# Patient Record
Sex: Female | Born: 1985 | Race: White | Hispanic: No | Marital: Married | Smoking: Never smoker
Health system: Southern US, Community
[De-identification: ages and names within clinical notes are randomized; demographics above are authoritative.]

## PROBLEM LIST (undated history)

## (undated) DIAGNOSIS — N189 Chronic kidney disease, unspecified: Secondary | ICD-10-CM

## (undated) DIAGNOSIS — I10 Essential (primary) hypertension: Secondary | ICD-10-CM

## (undated) DIAGNOSIS — D649 Anemia, unspecified: Secondary | ICD-10-CM

## (undated) DIAGNOSIS — F99 Mental disorder, not otherwise specified: Secondary | ICD-10-CM

## (undated) DIAGNOSIS — N946 Dysmenorrhea, unspecified: Secondary | ICD-10-CM

## (undated) DIAGNOSIS — M1990 Unspecified osteoarthritis, unspecified site: Secondary | ICD-10-CM

## (undated) DIAGNOSIS — R102 Pelvic and perineal pain: Secondary | ICD-10-CM

## (undated) DIAGNOSIS — N809 Endometriosis, unspecified: Secondary | ICD-10-CM

## (undated) DIAGNOSIS — F419 Anxiety disorder, unspecified: Secondary | ICD-10-CM

## (undated) DIAGNOSIS — F32A Depression, unspecified: Secondary | ICD-10-CM

## (undated) DIAGNOSIS — F329 Major depressive disorder, single episode, unspecified: Secondary | ICD-10-CM

## (undated) DIAGNOSIS — N920 Excessive and frequent menstruation with regular cycle: Secondary | ICD-10-CM

## (undated) DIAGNOSIS — G43909 Migraine, unspecified, not intractable, without status migrainosus: Secondary | ICD-10-CM

## (undated) HISTORY — DX: Dysmenorrhea, unspecified: N94.6

## (undated) HISTORY — DX: Unspecified osteoarthritis, unspecified site: M19.90

## (undated) HISTORY — DX: Mental disorder, not otherwise specified: F99

## (undated) HISTORY — DX: Anemia, unspecified: D64.9

## (undated) HISTORY — DX: Anxiety disorder, unspecified: F41.9

## (undated) HISTORY — DX: Excessive and frequent menstruation with regular cycle: N92.0

## (undated) HISTORY — DX: Depression, unspecified: F32.A

## (undated) HISTORY — PX: TUBAL LIGATION: SHX77

## (undated) HISTORY — DX: Chronic kidney disease, unspecified: N18.9

## (undated) HISTORY — DX: Major depressive disorder, single episode, unspecified: F32.9

## (undated) HISTORY — DX: Pelvic and perineal pain: R10.2

## (undated) HISTORY — DX: Essential (primary) hypertension: I10

## (undated) HISTORY — PX: STENT PLACEMENT RT URETER (ARMC HX): HXRAD1255

## (undated) HISTORY — DX: Endometriosis, unspecified: N80.9

---

## 2005-11-23 HISTORY — PX: CHOLECYSTECTOMY: SHX55

## 2014-11-23 HISTORY — PX: RENAL ARTERY STENT: SHX2321

## 2015-09-05 ENCOUNTER — Encounter: Payer: Self-pay | Admitting: Adult Health

## 2015-09-05 ENCOUNTER — Ambulatory Visit (INDEPENDENT_AMBULATORY_CARE_PROVIDER_SITE_OTHER): Payer: Medicaid Other | Admitting: Adult Health

## 2015-09-05 VITALS — BP 136/90 | HR 80 | Ht 60.0 in | Wt 181.5 lb

## 2015-09-05 DIAGNOSIS — N898 Other specified noninflammatory disorders of vagina: Secondary | ICD-10-CM

## 2015-09-05 DIAGNOSIS — D500 Iron deficiency anemia secondary to blood loss (chronic): Secondary | ICD-10-CM

## 2015-09-05 DIAGNOSIS — N946 Dysmenorrhea, unspecified: Secondary | ICD-10-CM

## 2015-09-05 DIAGNOSIS — N920 Excessive and frequent menstruation with regular cycle: Secondary | ICD-10-CM

## 2015-09-05 DIAGNOSIS — D649 Anemia, unspecified: Secondary | ICD-10-CM | POA: Insufficient documentation

## 2015-09-05 HISTORY — DX: Dysmenorrhea, unspecified: N94.6

## 2015-09-05 HISTORY — DX: Excessive and frequent menstruation with regular cycle: N92.0

## 2015-09-05 LAB — POCT WET PREP (WET MOUNT): WBC, Wet Prep HPF POC: POSITIVE

## 2015-09-05 NOTE — Patient Instructions (Signed)
Anemia, Nonspecific Anemia is a condition in which the concentration of red blood cells or hemoglobin in the blood is below normal. Hemoglobin is a substance in red blood cells that carries oxygen to the tissues of the body. Anemia results in not enough oxygen reaching these tissues.  CAUSES  Common causes of anemia include:   Excessive bleeding. Bleeding may be internal or external. This includes excessive bleeding from periods (in women) or from the intestine.   Poor nutrition.   Chronic kidney, thyroid, and liver disease.  Bone marrow disorders that decrease red blood cell production.  Cancer and treatments for cancer.  HIV, AIDS, and their treatments.  Spleen problems that increase red blood cell destruction.  Blood disorders.  Excess destruction of red blood cells due to infection, medicines, and autoimmune disorders. SIGNS AND SYMPTOMS   Minor weakness.   Dizziness.   Headache.  Palpitations.   Shortness of breath, especially with exercise.   Paleness.  Cold sensitivity.  Indigestion.  Nausea.  Difficulty sleeping.  Difficulty concentrating. Symptoms may occur suddenly or they may develop slowly.  DIAGNOSIS  Additional blood tests are often needed. These help your health care provider determine the best treatment. Your health care provider will check your stool for blood and look for other causes of blood loss.  TREATMENT  Treatment varies depending on the cause of the anemia. Treatment can include:   Supplements of iron, vitamin P82, or folic acid.   Hormone medicines.   A blood transfusion. This may be needed if blood loss is severe.   Hospitalization. This may be needed if there is significant continual blood loss.   Dietary changes.  Spleen removal. HOME CARE INSTRUCTIONS Keep all follow-up appointments. It often takes many weeks to correct anemia, and having your health care provider check on your condition and your response to  treatment is very important. SEEK IMMEDIATE MEDICAL CARE IF:   You develop extreme weakness, shortness of breath, or chest pain.   You become dizzy or have trouble concentrating.  You develop heavy vaginal bleeding.   You develop a rash.   You have bloody or black, tarry stools.   You faint.   You vomit up blood.   You vomit repeatedly.   You have abdominal pain.  You have a fever or persistent symptoms for more than 2-3 days.   You have a fever and your symptoms suddenly get worse.   You are dehydrated.  MAKE SURE YOU:  Understand these instructions.  Will watch your condition.  Will get help right away if you are not doing well or get worse.   This information is not intended to replace advice given to you by your health care provider. Make sure you discuss any questions you have with your health care provider.   Document Released: 12/17/2004 Document Revised: 07/12/2013 Document Reviewed: 05/05/2013 Elsevier Interactive Patient Education 2016 Elsevier Inc[employer] Dysmenorrhea Menstrual cramps (dysmenorrhea) are caused by the muscles of the uterus tightening (contracting) during a menstrual period. For some women, this discomfort is merely bothersome. For others, dysmenorrhea can be severe enough to interfere with everyday activities for a few days each month. Primary dysmenorrhea is menstrual cramps that last a couple of days when you start having menstrual periods or soon after. This often begins after a teenager starts having her period. As a woman gets older or has a baby, the cramps will usually lessen or disappear. Secondary dysmenorrhea begins later in life, lasts longer, and the pain may be stronger  than primary dysmenorrhea. The pain may start before the period and last a few days after the period.  CAUSES  Dysmenorrhea is usually caused by an underlying problem, such as:  The tissue lining the uterus grows outside of the uterus in other areas of the body  (endometriosis).  The endometrial tissue, which normally lines the uterus, is found in or grows into the muscular walls of the uterus (adenomyosis).  The pelvic blood vessels are engorged with blood just before the menstrual period (pelvic congestive syndrome).  Overgrowth of cells (polyps) in the lining of the uterus or cervix.  Falling down of the uterus (prolapse) because of loose or stretched ligaments.  Depression.  Bladder problems, infection, or inflammation.  Problems with the intestine, a tumor, or irritable bowel syndrome.  Cancer of the female organs or bladder.  A severely tipped uterus.  A very tight opening or closed cervix.  Noncancerous tumors of the uterus (fibroids).  Pelvic inflammatory disease (PID).  Pelvic scarring (adhesions) from a previous surgery.  Ovarian cyst.  An intrauterine device (IUD) used for birth control. RISK FACTORS You may be at greater risk of dysmenorrhea if:  You are younger than age 26.  You started puberty early.  You have irregular or heavy bleeding.  You have never given birth.  You have a family history of this problem.  You are a smoker. SIGNS AND SYMPTOMS   Cramping or throbbing pain in your lower abdomen.  Headaches.  Lower back pain.  Nausea or vomiting.  Diarrhea.  Sweating or dizziness.  Loose stools. DIAGNOSIS  A diagnosis is based on your history, symptoms, physical exam, diagnostic tests, or procedures. Diagnostic tests or procedures may include:  Blood tests.  Ultrasonography.  An examination of the lining of the uterus (dilation and curettage, D&C).  An examination inside your abdomen or pelvis with a scope (laparoscopy).  X-rays.  CT scan.  MRI.  An examination inside the bladder with a scope (cystoscopy).  An examination inside the intestine or stomach with a scope (colonoscopy, gastroscopy). TREATMENT  Treatment depends on the cause of the dysmenorrhea. Treatment may  include:  Pain medicine prescribed by your health care provider.  Birth control pills or an IUD with progesterone hormone in it.  Hormone replacement therapy.  Nonsteroidal anti-inflammatory drugs (NSAIDs). These may help stop the production of prostaglandins.  Surgery to remove adhesions, endometriosis, ovarian cyst, or fibroids.  Removal of the uterus (hysterectomy).  Progesterone shots to stop the menstrual period.  Cutting the nerves on the sacrum that go to the female organs (presacral neurectomy).  Electric current to the sacral nerves (sacral nerve stimulation).  Antidepressant medicine.  Psychiatric therapy, counseling, or group therapy.  Exercise and physical therapy.  Meditation and yoga therapy.  Acupuncture. HOME CARE INSTRUCTIONS   Only take over-the-counter or prescription medicines as directed by your health care provider.  Place a heating pad or hot water bottle on your lower back or abdomen. Do not sleep with the heating pad.  Use aerobic exercises, walking, swimming, biking, and other exercises to help lessen the cramping.  Massage to the lower back or abdomen may help.  Stop smoking.  Avoid alcohol and caffeine. SEEK MEDICAL CARE IF:   Your pain does not get better with medicine.  You have pain with sexual intercourse.  Your pain increases and is not controlled with medicines.  You have abnormal vaginal bleeding with your period.  You develop nausea or vomiting with your period that is not controlled  with medicine. SEEK IMMEDIATE MEDICAL CARE IF:  You pass out.    This information is not intended to replace advice given to you by your health care provider. Make sure you discuss any questions you have with your health care provider.   Document Released: 11/09/2005 Document Revised: 07/12/2013 Document Reviewed: 04/27/2013 Elsevier Interactive Patient Education 2016 [employer]. Menorrhagia Menorrhagia is the medical term for when your  menstrual periods are heavy or last longer than usual. With menorrhagia, every period you have may cause enough blood loss and cramping that you are unable to maintain your usual activities. CAUSES  In some cases, the cause of heavy periods is unknown, but a number of conditions may cause menorrhagia. Common causes include:  A problem with the hormone-producing thyroid gland (hypothyroid).  Noncancerous growths in the uterus (polyps or fibroids).  An imbalance of the estrogen and progesterone hormones.  One of your ovaries not releasing an egg during one or more months.  Side effects of having an intrauterine device (IUD).  Side effects of some medicines, such as anti-inflammatory medicines or blood thinners.  A bleeding disorder that stops your blood from clotting normally. SIGNS AND SYMPTOMS  During a normal period, bleeding lasts between 4 and 8 days. Signs that your periods are too heavy include:  You routinely have to change your pad or tampon every 1 or 2 hours because it is completely soaked.  You pass blood clots larger than 1 inch (2.5 cm) in size.  You have bleeding for more than 7 days.  You need to use pads and tampons at the same time because of heavy bleeding.  You need to wake up to change your pads or tampons during the night.  You have symptoms of anemia, such as tiredness, fatigue, or shortness of breath. DIAGNOSIS  Your health care provider will perform a physical exam and ask you questions about your symptoms and menstrual history. Other tests may be ordered based on what the health care provider finds during the exam. These tests can include:  Blood tests. Blood tests are used to check if you are pregnant or have hormonal changes, a bleeding or thyroid disorder, low iron levels (anemia), or other problems.  Endometrial biopsy. Your health care provider takes a sample of tissue from the inside of your uterus to be examined under a microscope.  Pelvic  ultrasound. This test uses sound waves to make a picture of your uterus, ovaries, and vagina. The pictures can show if you have fibroids or other growths.  Hysteroscopy. For this test, your health care provider will use a small telescope to look inside your uterus. Based on the results of your initial tests, your health care provider may recommend further testing. TREATMENT  Treatment may not be needed. If it is needed, your health care provider may recommend treatment with one or more medicines first. If these do not reduce bleeding enough, a surgical treatment might be an option. The best treatment for you will depend on:   Whether you need to prevent pregnancy.  Your desire to have children in the future.  The cause and severity of your bleeding.  Your opinion and personal preference.  Medicines for menorrhagia may include:  Birth control methods that use hormones. These include the pill, skin patch, vaginal ring, shots that you get every 3 months, hormonal IUD, and implant. These treatments reduce bleeding during your menstrual period.  Medicines that thicken blood and slow bleeding.  Medicines that reduce swelling, such as  ibuprofen.  Medicines that contain a synthetic hormone called progestin.   Medicines that make the ovaries stop working for a short time.  You may need surgical treatment for menorrhagia if the medicines are unsuccessful. Treatment options include:  Dilation and curettage (D&C). In this procedure, your health care provider opens (dilates) your cervix and then scrapes or suctions tissue from the lining of your uterus to reduce menstrual bleeding.  Operative hysteroscopy. This procedure uses a tiny tube with a light (hysteroscope) to view your uterine cavity and can help in the surgical removal of a polyp that may be causing heavy periods.  Endometrial ablation. Through various techniques, your health care provider permanently destroys the entire lining of  your uterus (endometrium). After endometrial ablation, most women have little or no menstrual flow. Endometrial ablation reduces your ability to become pregnant.  Endometrial resection. This surgical procedure uses an electrosurgical wire loop to remove the lining of the uterus. This procedure also reduces your ability to become pregnant.  Hysterectomy. Surgical removal of the uterus and cervix is a permanent procedure that stops menstrual periods. Pregnancy is not possible after a hysterectomy. This procedure requires anesthesia and hospitalization. HOME CARE INSTRUCTIONS   Only take over-the-counter or prescription medicines as directed by your health care provider. Take prescribed medicines exactly as directed. Do not change or switch medicines without consulting your health care provider.  Take any prescribed iron pills exactly as directed by your health care provider. Long-term heavy bleeding may result in low iron levels. Iron pills help replace the iron your body lost from heavy bleeding. Iron may cause constipation. If this becomes a problem, increase the bran, fruits, and roughage in your diet.  Do not take aspirin or medicines that contain aspirin 1 week before or during your menstrual period. Aspirin may make the bleeding worse.  If you need to change your sanitary pad or tampon more than once every 2 hours, stay in bed and rest as much as possible until the bleeding stops.  Eat well-balanced meals. Eat foods high in iron. Examples are leafy green vegetables, meat, liver, eggs, and whole grain breads and cereals. Do not try to lose weight until the abnormal bleeding has stopped and your blood iron level is back to normal. SEEK MEDICAL CARE IF:   You soak through a pad or tampon every 1 or 2 hours, and this happens every time you have a period.  You need to use pads and tampons at the same time because you are bleeding so much.  You need to change your pad or tampon during the  night.  You have a period that lasts for more than 8 days.  You pass clots bigger than 1 inch wide.  You have irregular periods that happen more or less often than once a month.  You feel dizzy or faint.  You feel very weak or tired.  You feel short of breath or feel your heart is beating too fast when you exercise.  You have nausea and vomiting or diarrhea while you are taking your medicine.  You have any problems that may be related to the medicine you are taking. SEEK IMMEDIATE MEDICAL CARE IF:   You soak through 4 or more pads or tampons in 2 hours.  You have any bleeding while you are pregnant. MAKE SURE YOU:   Understand these instructions.  Will watch your condition.  Will get help right away if you are not doing well or get worse.   This  information is not intended to replace advice given to you by your health care provider. Make sure you discuss any questions you have with your health care provider.   Document Released: 11/09/2005 Document Revised: 11/14/2013 Document Reviewed: 04/30/2013 Elsevier Interactive Patient Education 2016 [employer]. Take iron Return next week for [place] then see me 2 days later for pap and physical

## 2015-09-05 NOTE — Progress Notes (Signed)
Subjective:     Patient ID: Jessica Mckay, female   DOB: [birth year], 29 y.o.   MRN: [id number]  HPI Jessica Mckay is a 29 year old white female, engaged, G4P3 A1, new to this practice, in complaining of heavy periods for 3-4 months, she can change every 30 minutes at times.She also has anemia, hgb 8.5 and hct 29.4 08/18/15 at [facility], when admitted for kidney stone and stent insertion.She has had tubal but wants reversal in future.She has clots and cramps and is tired, she is taking iron supplement from hospital.She has not had pap and physical in over 3 years.  Review of Systems Patient denies any headaches, hearing loss,  blurred vision, shortness of breath, chest pain, abdominal pain, problems with bowel movements, urination, or intercourse. No joint pain or mood swings.See HPI for positives. Reviewed past medical,surgical, social and family history. Reviewed medications and allergies.     Objective:   Physical Exam BP 136/90 mmHg  Pulse 80  Ht 5' (1.524 m)  Wt 181 lb 8 oz (82.328 kg)  BMI 35.45 kg/m2  LMP 08/10/2015 Skin warm and dry. Neck: mid line trachea, normal thyroid, good ROM, no lymphadenopathy noted. Lungs: clear to ausculation bilaterally. Cardiovascular: regular rate and rhythm,Abdomen soft, non tender, no HSM noted.Pelvic: external genitalia is normal in appearance no lesions, vagina: tan discharge without odor,urethra has no lesions or masses noted, cervix is everted and non tender, uterus: normal size, shape and contour, mildly tender, no masses felt, adnexa: no masses, mildly  tender throughout. Bladder is non tender and no masses felt. Wet prep:  +WBCs. GC/CHL obtained. Discussed getting labs and [place] and then discussed trying OCs or megace to stop periods and she agrees for now, also discussed that she had had 3 c-sections and that tubal reversal not a guarantee of getting pregnant and she understands. Reviewed her labs from [facility] that she brought with her.    Assessment:      Menorrhagia Dysmenorrhea Anemia     Plan:     Check CBC,CMP,TSH Return in 5-6 days for gyn [place] and then see me 2 days later for pap and physical GC/CHL sent Review handouts on menorrhagia and dysmenorrhea and anemia Continue iron

## 2015-09-06 ENCOUNTER — Telehealth: Payer: Self-pay | Admitting: Adult Health

## 2015-09-06 LAB — COMPREHENSIVE METABOLIC PANEL
ALK PHOS: 86 IU/L (ref 39–117)
ALT: 12 IU/L (ref 0–32)
AST: 13 IU/L (ref 0–40)
Albumin/Globulin Ratio: 1.6 (ref 1.1–2.5)
Albumin: 4.3 g/dL (ref 3.5–5.5)
BILIRUBIN TOTAL: 0.4 mg/dL (ref 0.0–1.2)
BUN/Creatinine Ratio: 12 (ref 8–20)
BUN: 9 mg/dL (ref 6–20)
CHLORIDE: 99 mmol/L (ref 97–108)
CO2: 23 mmol/L (ref 18–29)
Calcium: 9.3 mg/dL (ref 8.7–10.2)
Creatinine, Ser: 0.73 mg/dL (ref 0.57–1.00)
GFR calc Af Amer: 129 mL/min/{1.73_m2} (ref >59)
GFR calc non Af Amer: 112 mL/min/{1.73_m2} (ref >59)
GLUCOSE: 87 mg/dL (ref 65–99)
Globulin, Total: 2.7 g/dL (ref 1.5–4.5)
Potassium: 4.1 mmol/L (ref 3.5–5.2)
Sodium: 140 mmol/L (ref 134–144)
Total Protein: 7 g/dL (ref 6.0–8.5)

## 2015-09-06 LAB — CBC
Hematocrit: 29.4 % — ABNORMAL LOW (ref 34.0–46.6)
Hemoglobin: 8.4 g/dL — ABNORMAL LOW (ref 11.1–15.9)
MCH: 20.4 pg — AB (ref 26.6–33.0)
MCHC: 28.6 g/dL — AB (ref 31.5–35.7)
MCV: 72 fL — ABNORMAL LOW (ref 79–97)
PLATELETS: 343 10*3/uL (ref 150–379)
RBC: 4.11 x10E6/uL (ref 3.77–5.28)
RDW: 16.4 % — ABNORMAL HIGH (ref 12.3–15.4)
WBC: 7.5 10*3/uL (ref 3.4–10.8)

## 2015-09-06 LAB — TSH: TSH: 1.41 u[IU]/mL (ref 0.450–4.500)

## 2015-09-06 NOTE — Telephone Encounter (Signed)
Pt aware of labs and is taking iron bid now hgb 8.4,keep appt next week

## 2015-09-07 LAB — GC/CHLAMYDIA PROBE AMP
Chlamydia trachomatis, NAA: NEGATIVE
Neisseria gonorrhoeae by PCR: NEGATIVE

## 2015-09-10 ENCOUNTER — Ambulatory Visit (INDEPENDENT_AMBULATORY_CARE_PROVIDER_SITE_OTHER): Payer: Medicaid Other

## 2015-09-10 DIAGNOSIS — N920 Excessive and frequent menstruation with regular cycle: Secondary | ICD-10-CM

## 2015-09-10 DIAGNOSIS — N946 Dysmenorrhea, unspecified: Secondary | ICD-10-CM | POA: Diagnosis not present

## 2015-09-10 NOTE — Progress Notes (Signed)
PELVIC US TA/TV: normal anteverted uterus,normal ov's bilat (mobile),small amount of cul de sac fluid,mult small nabothian cysts,EEC 21 mm,4.5 x 3.5 x 2 cm complex collection ant to bladder,deep to c-section scar.  Dr Glo Herring review images with pt during ultrasound.

## 2015-09-13 ENCOUNTER — Encounter: Payer: Self-pay | Admitting: Adult Health

## 2015-09-13 ENCOUNTER — Other Ambulatory Visit (HOSPITAL_COMMUNITY)
Admission: RE | Admit: 2015-09-13 | Discharge: 2015-09-13 | Disposition: A | Discharge status: Home / Self Care | Payer: Medicaid Other | Source: Ambulatory Visit | Attending: Adult Health | Admitting: Adult Health

## 2015-09-13 ENCOUNTER — Ambulatory Visit (INDEPENDENT_AMBULATORY_CARE_PROVIDER_SITE_OTHER): Payer: Medicaid Other | Admitting: Adult Health

## 2015-09-13 VITALS — BP 140/90 | HR 80 | Ht 60.0 in | Wt 184.0 lb

## 2015-09-13 DIAGNOSIS — Z1151 Encounter for screening for human papillomavirus (HPV): Secondary | ICD-10-CM | POA: Diagnosis not present

## 2015-09-13 DIAGNOSIS — N80129 Deep endometriosis of ovary, unspecified ovary: Secondary | ICD-10-CM

## 2015-09-13 DIAGNOSIS — Z01411 Encounter for gynecological examination (general) (routine) with abnormal findings: Secondary | ICD-10-CM | POA: Insufficient documentation

## 2015-09-13 DIAGNOSIS — Z01419 Encounter for gynecological examination (general) (routine) without abnormal findings: Secondary | ICD-10-CM

## 2015-09-13 DIAGNOSIS — N809 Endometriosis, unspecified: Secondary | ICD-10-CM

## 2015-09-13 DIAGNOSIS — N946 Dysmenorrhea, unspecified: Secondary | ICD-10-CM

## 2015-09-13 DIAGNOSIS — Z1389 Encounter for screening for other disorder: Secondary | ICD-10-CM | POA: Diagnosis not present

## 2015-09-13 DIAGNOSIS — N920 Excessive and frequent menstruation with regular cycle: Secondary | ICD-10-CM

## 2015-09-13 DIAGNOSIS — Z0000 Encounter for general adult medical examination without abnormal findings: Secondary | ICD-10-CM

## 2015-09-13 DIAGNOSIS — D500 Iron deficiency anemia secondary to blood loss (chronic): Secondary | ICD-10-CM

## 2015-09-13 HISTORY — DX: Endometriosis, unspecified: N80.9

## 2015-09-13 HISTORY — DX: Deep endometriosis of ovary, unspecified ovary: N80.129

## 2015-09-13 LAB — POCT URINALYSIS DIPSTICK
Glucose, UA: NEGATIVE
Leukocytes, UA: NEGATIVE
NITRITE UA: NEGATIVE
Protein, UA: NEGATIVE
RBC UA: NEGATIVE
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
## 2015-09-13 NOTE — Patient Instructions (Signed)
Physical in  1 year Request records on mammogram Return in 1 week for endo biopsy with Dr Glo Herring

## 2015-09-16 LAB — CYTOLOGY - PAP

## 2015-09-18 ENCOUNTER — Telehealth: Payer: Self-pay | Admitting: Adult Health

## 2015-09-18 NOTE — Telephone Encounter (Signed)
Pt aware pap abnormal,LSIL with +HPV will add colpo to appt Friday am has endo bx scheduled then with [facility]

## 2015-09-19 ENCOUNTER — Other Ambulatory Visit: Payer: Medicaid Other | Admitting: Obstetrics and Gynecology

## 2015-09-20 ENCOUNTER — Ambulatory Visit (INDEPENDENT_AMBULATORY_CARE_PROVIDER_SITE_OTHER): Payer: Medicaid Other | Admitting: Obstetrics and Gynecology

## 2015-09-20 ENCOUNTER — Encounter: Payer: Self-pay | Admitting: Obstetrics and Gynecology

## 2015-09-20 ENCOUNTER — Other Ambulatory Visit: Payer: Self-pay | Admitting: Obstetrics and Gynecology

## 2015-09-20 VITALS — BP 118/70 | Ht 60.0 in | Wt 181.5 lb

## 2015-09-20 DIAGNOSIS — Z3202 Encounter for pregnancy test, result negative: Secondary | ICD-10-CM

## 2015-09-20 DIAGNOSIS — R87612 Low grade squamous intraepithelial lesion on cytologic smear of cervix (LGSIL): Secondary | ICD-10-CM

## 2015-09-20 DIAGNOSIS — Z3200 Encounter for pregnancy test, result unknown: Secondary | ICD-10-CM

## 2015-09-20 DIAGNOSIS — N809 Endometriosis, unspecified: Secondary | ICD-10-CM

## 2015-09-20 DIAGNOSIS — N80129 Deep endometriosis of ovary, unspecified ovary: Secondary | ICD-10-CM

## 2015-09-20 LAB — POCT URINE PREGNANCY: Preg Test, Ur: NEGATIVE
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
## 2015-09-20 NOTE — Progress Notes (Signed)
Patient ID: Jessica Mckay, female   DOB: 10-Sep-1986, 29 y.o.   MRN: [id number] Pt here today for a colposcopy and an endometrial biopsy.

## 2015-10-04 ENCOUNTER — Ambulatory Visit (INDEPENDENT_AMBULATORY_CARE_PROVIDER_SITE_OTHER): Payer: Medicaid Other | Admitting: Obstetrics and Gynecology

## 2015-10-04 ENCOUNTER — Encounter: Payer: Self-pay | Admitting: Obstetrics and Gynecology

## 2015-10-04 VITALS — BP 120/80 | Ht 60.0 in | Wt 183.0 lb

## 2015-10-04 DIAGNOSIS — N80129 Deep endometriosis of ovary, unspecified ovary: Secondary | ICD-10-CM

## 2015-10-04 DIAGNOSIS — N809 Endometriosis, unspecified: Secondary | ICD-10-CM

## 2015-10-04 DIAGNOSIS — N806 Endometriosis in cutaneous scar: Secondary | ICD-10-CM

## 2015-10-04 MED ORDER — NORETHIN-ETH ESTRAD-FE BIPHAS 1 MG-10 MCG / 10 MCG PO TABS: 1.0000 | ORAL_TABLET | Freq: Every day | ORAL | Status: DC

## 2015-10-04 NOTE — Progress Notes (Signed)
Patient ID: Jessica Mckay, female   DOB: 03/22/1986, 29 y.o.   MRN: [id number] Pt here today for results.

## 2015-10-04 NOTE — Progress Notes (Signed)
Patient ID: Jessica Mckay, female   DOB: 12-Dec-1985, 29 y.o.   MRN: [id number]   [facility] Visit  Patient name: Jessica Mckay MRN [id number]  Date of birth: [birth year]  CC & HPI:  Jessica Mckay is a 29 y.o. 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 Additionally, pt has c/s scar endometrioma that is severely painful with menses. Further, pt reiterates that she wishes to have a tubal reversal-- pt reports she continues to save money for the procedure.   ROS:  For discussion only.   Pertinent History Reviewed:   Reviewed: Significant for cesarean section, endometrioma, menorrhagia with regular cycle, dysmenorrhea.  Medical         Past Medical History  Diagnosis Date  . Anemia   . Arthritis   . Hypertension     during pregnancy  . Menorrhagia with regular cycle 09/05/2015  . Dysmenorrhea 09/05/2015  . Chronic kidney disease     kidney stones and pyleo  . Endometrioma 09/13/2015                              Surgical Hx:    Past Surgical History  Procedure Laterality Date  . Cesarean section  2002,2006,2010  . Cholecystectomy  2007  . Tubal ligation    . Stent placement rt ureter ([facility] hx)     Medications: Reviewed & Updated - see associated section                       Current outpatient prescriptions:  .  iron polysaccharides (FERREX 150) 150 MG capsule, Take 150 mg by mouth 2 (two) times daily. , Disp: , Rfl:    Social History: Reviewed -  reports that she has never smoked. She has never used smokeless tobacco.  Objective Findings:  Vitals: Blood pressure 120/80, height 5' (1.524 m), weight 183 lb (83.008 kg), last menstrual period 09/16/2015.  Physical Examination: For discussion only.   Assessment & Plan:   A:  1. 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 2. Incision scar endometrioma 3. 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   P:  1. Pap in  one year.  2. Rx for [facility].  3. Continuous OCPs until pt ready for tubal reversal.      By signing my name below, I, Terressa Koyanagi, attest that this documentation has been prepared under the direction and in the presence of Mallory Shirk, MD. Electronically Signed: Terressa Koyanagi, ED Scribe. 10/04/2015. 9:18 AM.   `````Attestation of Attending Supervision of Advanced Practitioner: Evaluation and management procedures were performed by the PA/NP/CNM/OB Fellow under my supervision/collaboration. Chart reviewed and agree with management and plan.  Deazia Lampi V 10/04/2015 9:25 AM   .Alecia Lemming

## 2015-11-04 ENCOUNTER — Telehealth: Payer: Self-pay | Admitting: Adult Health

## 2015-11-04 NOTE — Telephone Encounter (Signed)
Called pt had reports of mammogram and [place] from 2014, it suggested F/U mammogram at right for ?lymph node, but her exam was normal this year and she is without complaints and declines further imaging at this time

## 2015-11-08 ENCOUNTER — Encounter: Payer: Self-pay | Admitting: Obstetrics and Gynecology

## 2016-02-12 ENCOUNTER — Encounter: Payer: Self-pay | Admitting: Adult Health

## 2016-02-12 ENCOUNTER — Ambulatory Visit (INDEPENDENT_AMBULATORY_CARE_PROVIDER_SITE_OTHER): Payer: Medicaid Other | Admitting: Adult Health

## 2016-02-12 VITALS — BP 130/80 | HR 80 | Ht 60.0 in | Wt 197.0 lb

## 2016-02-12 DIAGNOSIS — Z3202 Encounter for pregnancy test, result negative: Secondary | ICD-10-CM | POA: Diagnosis not present

## 2016-02-12 DIAGNOSIS — N809 Endometriosis, unspecified: Secondary | ICD-10-CM

## 2016-02-12 DIAGNOSIS — D500 Iron deficiency anemia secondary to blood loss (chronic): Secondary | ICD-10-CM

## 2016-02-12 DIAGNOSIS — N920 Excessive and frequent menstruation with regular cycle: Secondary | ICD-10-CM

## 2016-02-12 DIAGNOSIS — R102 Pelvic and perineal pain: Secondary | ICD-10-CM | POA: Diagnosis not present

## 2016-02-12 DIAGNOSIS — N946 Dysmenorrhea, unspecified: Secondary | ICD-10-CM | POA: Diagnosis not present

## 2016-02-12 DIAGNOSIS — N80129 Deep endometriosis of ovary, unspecified ovary: Secondary | ICD-10-CM

## 2016-02-12 HISTORY — DX: Pelvic and perineal pain: R10.2

## 2016-02-12 LAB — POCT URINE PREGNANCY: Preg Test, Ur: NEGATIVE

## 2016-02-12 MED ORDER — MEGESTROL ACETATE 40 MG PO TABS: 40.0000 mg | ORAL_TABLET | Freq: Every day | ORAL | Status: DC

## 2016-02-12 NOTE — Progress Notes (Signed)
Subjective:     Patient ID: Jessica Mckay, female   DOB: 1986-05-17, 30 y.o.   MRN: [id number]  HPI Jessica Mckay is a 30 year old white female in complaining of pain at C section scar, has known endometrioma and has frequent headaches and is tired and feels weak at times, and period heavy and painful.She was placed on iron for anemia and was given OCs, but she stopped them, she said they made her sick.  Review of Systems Patient denies any  hearing loss, blurred vision, shortness of breath, chest pain,  problems with bowel movements, urination, or intercourse. No joint pain or mood swings. See HPI for positives Reviewed past medical,surgical, social and family history. Reviewed medications and allergies.     Objective:   Physical Exam BP 130/80 mmHg  Pulse 80  Ht 5' (1.524 m)  Wt 197 lb (89.359 kg)  BMI 38.47 kg/m2  LMP 01/22/2016 UPT negative. Skin warm and dry, Lungs: clear to ausculation bilaterally. Cardiovascular: regular rate and rhythm..Pelvic: external genitalia is normal in appearance no lesions, vagina: scant discharge without odor,urethra has no lesions or masses noted, cervix:smooth, uterus: normal size, shape and contour, non tender, no masses felt, adnexa: no masses or tenderness noted. Bladder is non tender and no masses felt. Abdomen tender at C section scar.   Will try megace to stop periods, she says she still wants tubal reversal.    Assessment:     Menorrhagia  Dysmenorrhea Anemia  Endometrioma Pelvic pain     Plan:    Continue Iron tabs daily  Check CBC Rex Megace 40 mg #30 take 1 daily with 1 refill Follow up in 4 weeks

## 2016-02-12 NOTE — Patient Instructions (Signed)
Take iron and megace Follow up in 4 weeks

## 2016-02-13 ENCOUNTER — Telehealth: Payer: Self-pay | Admitting: Adult Health

## 2016-02-13 LAB — CBC
HEMATOCRIT: 33.9 % — AB (ref 34.0–46.6)
HEMOGLOBIN: 10.1 g/dL — AB (ref 11.1–15.9)
MCH: 22.1 pg — ABNORMAL LOW (ref 26.6–33.0)
MCHC: 29.8 g/dL — ABNORMAL LOW (ref 31.5–35.7)
MCV: 74 fL — ABNORMAL LOW (ref 79–97)
Platelets: 287 10*3/uL (ref 150–379)
RBC: 4.56 x10E6/uL (ref 3.77–5.28)
RDW: 18.4 % — ABNORMAL HIGH (ref 12.3–15.4)
WBC: 9.2 10*3/uL (ref 3.4–10.8)

## 2016-02-13 NOTE — Telephone Encounter (Signed)
Pt aware HGB 10.1 which is better, keep taking iron

## 2016-03-11 ENCOUNTER — Encounter: Payer: Self-pay | Admitting: Adult Health

## 2016-03-11 ENCOUNTER — Ambulatory Visit (INDEPENDENT_AMBULATORY_CARE_PROVIDER_SITE_OTHER): Payer: Medicaid Other | Admitting: Adult Health

## 2016-03-11 VITALS — BP 122/72 | HR 86 | Ht 60.0 in | Wt 198.5 lb

## 2016-03-11 DIAGNOSIS — D500 Iron deficiency anemia secondary to blood loss (chronic): Secondary | ICD-10-CM

## 2016-03-11 DIAGNOSIS — N920 Excessive and frequent menstruation with regular cycle: Secondary | ICD-10-CM

## 2016-03-11 DIAGNOSIS — N946 Dysmenorrhea, unspecified: Secondary | ICD-10-CM

## 2016-03-11 MED ORDER — KETOROLAC TROMETHAMINE 10 MG PO TABS: 10.0000 mg | ORAL_TABLET | Freq: Four times a day (QID) | ORAL | Status: DC | PRN

## 2016-03-11 MED ORDER — MEGESTROL ACETATE 40 MG PO TABS: ORAL_TABLET | ORAL | Status: DC

## 2016-03-11 NOTE — Patient Instructions (Signed)
Take megace  try toradol  Follow up in 4 weeks Take iron

## 2016-03-11 NOTE — Progress Notes (Signed)
Subjective:     Patient ID: Jessica Mckay, female   DOB: [birth year], 30 y.o.   MRN: [id number]  HPI [name] is a 30 year old white female back fin follow up of starting megace to stop bleeding, it helped but bleeding back and has cramps and does not sleep well when cramping, still wants tubal reversal for 1 more child. HGB was 10.1 last visit which was better.  Review of Systems Patient denies any headaches, hearing loss, fatigue, blurred vision, shortness of breath, chest pain, problems with bowel movements, urination, or intercourse. No joint pain or mood swings.See HPI for positives Reviewed past medical,surgical, social and family history. Reviewed medications and allergies.     Objective:   Physical Exam BP 122/72 mmHg  Pulse 86  Ht 5' (1.524 m)  Wt 198 lb 8 oz (90.039 kg)  BMI 38.77 kg/m2  LMP 04/18/2017Talk only, bleeding had stopped but started back yesterday, still has cramps, will increase megace dose for several days and try toradol, follow up in 4 weeks will check CBC then. Face time 10 minutes talking and counseling.    Assessment:     Menorrhagia Dysmenorrhea Anemia     Plan:    Push fluids Continue iron tablets daily Rx megace 40 mg #45 3 x 5 days then 2 x 5 days then 1 daily with 1 refill   Rx toradol 10 mg #20 take 1 every 6 hours prn severe cramps no refills Follow up in 4 weeks,check CBC then

## 2016-04-08 ENCOUNTER — Encounter: Payer: Self-pay | Admitting: Adult Health

## 2016-04-08 ENCOUNTER — Ambulatory Visit (INDEPENDENT_AMBULATORY_CARE_PROVIDER_SITE_OTHER): Payer: Medicaid Other | Admitting: Adult Health

## 2016-04-08 VITALS — BP 136/80 | HR 89 | Ht 60.0 in | Wt 200.0 lb

## 2016-04-08 DIAGNOSIS — N920 Excessive and frequent menstruation with regular cycle: Secondary | ICD-10-CM

## 2016-04-08 DIAGNOSIS — N946 Dysmenorrhea, unspecified: Secondary | ICD-10-CM

## 2016-04-08 MED ORDER — MEGESTROL ACETATE 40 MG PO TABS: 40.0000 mg | ORAL_TABLET | Freq: Two times a day (BID) | ORAL | Status: DC

## 2016-04-08 MED ORDER — KETOROLAC TROMETHAMINE 10 MG PO TABS: 10.0000 mg | ORAL_TABLET | Freq: Four times a day (QID) | ORAL | Status: DC | PRN

## 2016-04-08 NOTE — Progress Notes (Signed)
Subjective:     Patient ID: Jessica Mckay, female   DOB: 04/14/1986, 30 y.o.   MRN: [id number]  HPI Jessica Mckay is a 30 year old white female back in follow up of taking megace, bleeding is on and off and is lighter, she passed some tissue, looked like decidual cast, still cramps and is tired and has headaches at times.Not sleeping good.   Review of Systems  Patient denies any  Daily headaches, hearing loss, blurred vision, shortness of breath, chest pain, problems with bowel movements, urination, or intercourse. No joint pain or mood swings. See HPI for positives. Reviewed past medical,surgical, social and family history. Reviewed medications and allergies.     Objective:   Physical Exam BP 136/80 mmHg  Pulse 89  Ht 5' (1.524 m)  Wt 200 lb (90.719 kg)  BMI 39.06 kg/m2  LMP 03/10/2016  Skin warm and dry.Pelvic: external genitalia is normal in appearance no lesions, vagina:scant period like blood,urethra has no lesions or masses noted, cervix:smooth and bulbous, uterus: normal size, shape and contour, non tender, no masses felt, adnexa: no masses or tenderness noted. Bladder is non tender and no masses felt. Discussed with her that since she wants tubal reversal in future, megace is our best option for now, and she voices understanding, it repeat to her that IUD is option too.Will check labs today to assess for anemia and thyroid function. Told her to follow up with PCP about sleeping issues.  Face time 15 minutes with 50 % counseling.    Assessment:     Menorrhagia Dysmenorrhea     Plan:    Check CBC,CMP and TSH Rx megace 40 mg #60 take 1 bid with 3 refills Refilled toradol 10 mg #20 take 1 every 6 hours prn severe cramps Continue OTC iron Follow up in 3 months

## 2016-04-09 ENCOUNTER — Telehealth: Payer: Self-pay | Admitting: Adult Health

## 2016-04-09 LAB — COMPREHENSIVE METABOLIC PANEL
A/G RATIO: 1.4 (ref 1.2–2.2)
ALBUMIN: 4.2 g/dL (ref 3.5–5.5)
ALK PHOS: 84 IU/L (ref 39–117)
ALT: 10 IU/L (ref 0–32)
AST: 13 IU/L (ref 0–40)
BUN / CREAT RATIO: 14 (ref 9–23)
BUN: 12 mg/dL (ref 6–20)
CHLORIDE: 102 mmol/L (ref 96–106)
CO2: 21 mmol/L (ref 18–29)
CREATININE: 0.86 mg/dL (ref 0.57–1.00)
Calcium: 9.1 mg/dL (ref 8.7–10.2)
GFR calc Af Amer: 105 mL/min/{1.73_m2} (ref >59)
GFR calc non Af Amer: 91 mL/min/{1.73_m2} (ref >59)
GLOBULIN, TOTAL: 3.1 g/dL (ref 1.5–4.5)
Glucose: 89 mg/dL (ref 65–99)
POTASSIUM: 4.3 mmol/L (ref 3.5–5.2)
SODIUM: 139 mmol/L (ref 134–144)
Total Protein: 7.3 g/dL (ref 6.0–8.5)

## 2016-04-09 LAB — CBC
HEMATOCRIT: 34.8 % (ref 34.0–46.6)
HEMOGLOBIN: 10.7 g/dL — AB (ref 11.1–15.9)
MCH: 22.6 pg — AB (ref 26.6–33.0)
MCHC: 30.7 g/dL — AB (ref 31.5–35.7)
MCV: 73 fL — ABNORMAL LOW (ref 79–97)
Platelets: 308 10*3/uL (ref 150–379)
RBC: 4.74 x10E6/uL (ref 3.77–5.28)
RDW: 16.6 % — AB (ref 12.3–15.4)
WBC: 10.6 10*3/uL (ref 3.4–10.8)

## 2016-04-09 LAB — TSH: TSH: 3.27 u[IU]/mL (ref 0.450–4.500)

## 2016-04-09 NOTE — Telephone Encounter (Signed)
Pt aware of labs, and hgb better,continue iron and megace

## 2016-05-28 DIAGNOSIS — Z029 Encounter for administrative examinations, unspecified: Secondary | ICD-10-CM

## 2016-06-23 ENCOUNTER — Other Ambulatory Visit: Payer: Self-pay | Admitting: Adult Health

## 2016-06-23 MED ORDER — KETOROLAC TROMETHAMINE 10 MG PO TABS: 10.0000 mg | ORAL_TABLET | Freq: Four times a day (QID) | ORAL | 0 refills | Status: DC | PRN

## 2016-07-09 ENCOUNTER — Ambulatory Visit (INDEPENDENT_AMBULATORY_CARE_PROVIDER_SITE_OTHER): Payer: Medicaid Other | Admitting: Adult Health

## 2016-07-09 ENCOUNTER — Encounter: Payer: Self-pay | Admitting: Adult Health

## 2016-07-09 VITALS — BP 128/90 | HR 97 | Ht 60.0 in | Wt 214.0 lb

## 2016-07-09 DIAGNOSIS — H65111 Acute and subacute allergic otitis media (mucoid) (sanguinous) (serous), right ear: Secondary | ICD-10-CM

## 2016-07-09 DIAGNOSIS — D500 Iron deficiency anemia secondary to blood loss (chronic): Secondary | ICD-10-CM

## 2016-07-09 DIAGNOSIS — H8190 Unspecified disorder of vestibular function, unspecified ear: Secondary | ICD-10-CM

## 2016-07-09 DIAGNOSIS — H838X9 Other specified diseases of inner ear, unspecified ear: Secondary | ICD-10-CM | POA: Diagnosis not present

## 2016-07-09 DIAGNOSIS — N920 Excessive and frequent menstruation with regular cycle: Secondary | ICD-10-CM

## 2016-07-09 MED ORDER — CIPROFLOXACIN-DEXAMETHASONE 0.3-0.1 % OT SUSP: 4.0000 [drp] | Freq: Two times a day (BID) | OTIC | 0 refills | Status: DC

## 2016-07-09 MED ORDER — DIAZEPAM 2 MG PO TABS: 2.0000 mg | ORAL_TABLET | Freq: Three times a day (TID) | ORAL | 0 refills | Status: DC | PRN

## 2016-07-09 MED ORDER — MEGESTROL ACETATE 40 MG PO TABS: 40.0000 mg | ORAL_TABLET | Freq: Two times a day (BID) | ORAL | 3 refills | Status: DC

## 2016-07-09 MED ORDER — CETIRIZINE HCL 10 MG PO CAPS: ORAL_CAPSULE | ORAL | Status: DC

## 2016-07-09 NOTE — Progress Notes (Signed)
Subjective:     Patient ID: Jessica Mckay, female   DOB: June 07, 1986, 30 y.o.   MRN: [id number]  HPI Jessica Mckay is a 30 year old white female back in follow up of taking megace for heavy bleeding and she says it has finally stopped and she continues to take, she is complaining of being dizzy with rising and has headache and nausea at times, has seen PCP, [facility] and had labs.  Review of Systems Patient denies any hearing loss, fatigue, blurred vision, shortness of breath, chest pain, abdominal pain, problems with bowel movements, urination, or intercourse. No joint pain or mood swings. See HPI for positives. Reviewed past medical,surgical, social and family history. Reviewed medications and allergies.     Objective:   Physical Exam BP 128/90   Pulse 97   Ht 5' (1.524 m)   Wt 214 lb (97.1 kg)   BMI 41.79 kg/m  Skin warm and dry. Neck: mid line trachea, normal thyroid, good ROM, no lymphadenopathy noted. Lungs: clear to ausculation bilaterally. Cardiovascular: regular rate and rhythm.Right ear red, diminished light reflex, left ear normal, has dizziness with sudden change in positions but no nystagmus. Will continue megace and will try valium 2 mg prn and ear gtts.Change positions slowly. Face time 15 mintues.    Assessment:       Menorrhagia with regular cycle  Iron deficiency anemia due to chronic blood loss  Vestibular dizziness, unspecified laterality  Subacute allergic otitis media of right ear, recurrence not specified     Plan:     Refilled megace 40 mg #60 take 1 bid with 3 refills Rx valium 2 mg #30 take 1 every 8 hours prn vertigo no refills Rx ciprodex 4 gtts bid right ear Follow up in 3 months See PCP if dizziness continues  Review handout on vertigo

## 2016-07-09 NOTE — Patient Instructions (Signed)

## 2016-10-05 ENCOUNTER — Other Ambulatory Visit: Payer: Self-pay | Admitting: Medical Oncology

## 2016-10-05 DIAGNOSIS — N6452 Nipple discharge: Secondary | ICD-10-CM

## 2016-10-09 ENCOUNTER — Ambulatory Visit (INDEPENDENT_AMBULATORY_CARE_PROVIDER_SITE_OTHER): Payer: Medicaid Other | Admitting: Adult Health

## 2016-10-09 ENCOUNTER — Encounter: Payer: Self-pay | Admitting: Adult Health

## 2016-10-09 VITALS — BP 142/82 | HR 92 | Ht 60.0 in | Wt 219.0 lb

## 2016-10-09 DIAGNOSIS — F329 Major depressive disorder, single episode, unspecified: Secondary | ICD-10-CM

## 2016-10-09 DIAGNOSIS — E669 Obesity, unspecified: Secondary | ICD-10-CM

## 2016-10-09 DIAGNOSIS — R109 Unspecified abdominal pain: Secondary | ICD-10-CM | POA: Diagnosis not present

## 2016-10-09 DIAGNOSIS — Z6841 Body Mass Index (BMI) 40.0 and over, adult: Secondary | ICD-10-CM

## 2016-10-09 DIAGNOSIS — F32A Depression, unspecified: Secondary | ICD-10-CM

## 2016-10-09 DIAGNOSIS — Z3202 Encounter for pregnancy test, result negative: Secondary | ICD-10-CM | POA: Diagnosis not present

## 2016-10-09 LAB — POCT URINE PREGNANCY: PREG TEST UR: NEGATIVE

## 2016-10-09 NOTE — Progress Notes (Signed)
Subjective:     Patient ID: Jessica Mckay, female   DOB: 1986/06/08, 30 y.o.   MRN: [id number]  HPI Stefane is a 30 year old white female back in follow up taking megace for heavy periods and the she has not had any bleeding in about 2 months.She has seen Dr Meda Coffee at [facility] in [place] and had labs and is still anemic, and has had rectal bleeding(Dr Meda Coffee, checking this out) and stomach pain with some nausea and vomiting and has UI at times.She said Dr Meda Coffee stopped her megace for now and she has F/U appt with her Monday (11/20) at 9 am.She says she has rash on stomach too.She has had GB removed. PCP is Dr Meda Coffee.  Review of Systems +stomach pain with nausea and vomiting at times ?rash on stomach Has UI at times Reviewed past medical,surgical, social and family history. Reviewed medications and allergies.     Objective:   Physical Exam BP (!) 142/82 (BP Location: Left Arm, Patient Position: Sitting, Cuff Size: Large)   Pulse 92   Ht 5' (1.524 m)   Wt 219 lb (99.3 kg)   BMI 42.77 kg/m    UPT negative Skin warm and dry, Abdomen is soft and tender in epigastric area, no HSM noted.No obvious rash but has striae and can see where she has scratched some. Pelvic: external genitalia is normal in appearance no lesions, vagina: white discharge without odor,urethra has no lesions or masses noted, cervix:smooth and bulbous, uterus: normal size, shape and contour, non tender, no masses felt, adnexa: no masses or tenderness noted. Bladder is non tender and no masses felt.  PHQ 9 score 26, she is on meds and sees Dr Meda Coffee about this too, and she denies any suicidal ideations. Will let Dr Meda Coffee decide about further megace at this time, did suggest she try to lose some weight. Will talk when labs back, told her could be ulcer, causing stomach pain.  Assessment:        1. Stomach pain   2. Depression, unspecified depression type   3. Pregnancy examination or test, negative result    Plan:    Check H pylori Follow up with Dr Meda Coffee Monday Follow up here prn

## 2016-10-09 NOTE — Patient Instructions (Signed)
Follow up with Dr Meda Coffee Monday Follow up prn

## 2016-10-12 LAB — H. PYLORI ANTIBODY, IGG: H Pylori IgG: 2.4 U/mL — ABNORMAL HIGH (ref 0.0–0.8)

## 2016-10-13 ENCOUNTER — Telehealth: Payer: Self-pay | Admitting: Adult Health

## 2016-10-13 MED ORDER — LANSOPRAZOLE 30 MG PO CPDR
30.0000 mg | DELAYED_RELEASE_CAPSULE | Freq: Two times a day (BID) | ORAL | 0 refills | Status: DC
Start: 2016-10-13 — End: 2017-08-12

## 2016-10-13 MED ORDER — TETRACYCLINE HCL 500 MG PO CAPS: 500.0000 mg | ORAL_CAPSULE | Freq: Two times a day (BID) | ORAL | 0 refills | Status: DC

## 2016-10-13 MED ORDER — METRONIDAZOLE 500 MG PO TABS: 500.0000 mg | ORAL_TABLET | Freq: Three times a day (TID) | ORAL | 0 refills | Status: DC

## 2016-10-13 NOTE — Telephone Encounter (Signed)
Pt aware H pylori +, will treat with lansoprazole 30 mg 1 bid x 14 days and flagyl 500 mg 1 tid x 14 days and tetracycline 500 bid x 14 days

## 2016-10-20 DIAGNOSIS — Z029 Encounter for administrative examinations, unspecified: Secondary | ICD-10-CM

## 2016-10-24 ENCOUNTER — Encounter: Payer: Self-pay | Admitting: Adult Health

## 2017-03-19 ENCOUNTER — Encounter: Payer: Self-pay | Admitting: Oncology

## 2017-03-19 ENCOUNTER — Inpatient Hospital Stay: Payer: Medicaid Other

## 2017-03-19 ENCOUNTER — Inpatient Hospital Stay: Payer: Medicaid Other | Attending: Oncology | Admitting: Oncology

## 2017-03-19 VITALS — BP 138/82 | HR 93 | Temp 96.2°F | Resp 18 | Ht 60.0 in | Wt 216.5 lb

## 2017-03-19 VITALS — BP 138/94 | HR 92 | Resp 18

## 2017-03-19 DIAGNOSIS — D509 Iron deficiency anemia, unspecified: Secondary | ICD-10-CM | POA: Insufficient documentation

## 2017-03-19 DIAGNOSIS — Z79899 Other long term (current) drug therapy: Secondary | ICD-10-CM | POA: Diagnosis not present

## 2017-03-19 DIAGNOSIS — Z87442 Personal history of urinary calculi: Secondary | ICD-10-CM | POA: Insufficient documentation

## 2017-03-19 DIAGNOSIS — N920 Excessive and frequent menstruation with regular cycle: Secondary | ICD-10-CM | POA: Insufficient documentation

## 2017-03-19 DIAGNOSIS — Z880 Allergy status to penicillin: Secondary | ICD-10-CM | POA: Diagnosis not present

## 2017-03-19 DIAGNOSIS — N189 Chronic kidney disease, unspecified: Secondary | ICD-10-CM | POA: Diagnosis not present

## 2017-03-19 DIAGNOSIS — I129 Hypertensive chronic kidney disease with stage 1 through stage 4 chronic kidney disease, or unspecified chronic kidney disease: Secondary | ICD-10-CM | POA: Insufficient documentation

## 2017-03-19 DIAGNOSIS — R197 Diarrhea, unspecified: Secondary | ICD-10-CM | POA: Diagnosis not present

## 2017-03-19 DIAGNOSIS — E669 Obesity, unspecified: Secondary | ICD-10-CM | POA: Diagnosis not present

## 2017-03-19 DIAGNOSIS — F329 Major depressive disorder, single episode, unspecified: Secondary | ICD-10-CM | POA: Insufficient documentation

## 2017-03-19 DIAGNOSIS — R5383 Other fatigue: Secondary | ICD-10-CM | POA: Diagnosis not present

## 2017-03-19 DIAGNOSIS — R5381 Other malaise: Secondary | ICD-10-CM | POA: Insufficient documentation

## 2017-03-19 MED ORDER — ONDANSETRON HCL 4 MG/2ML IJ SOLN
4.0000 mg | Freq: Once | INTRAMUSCULAR | Status: AC
  Administered 2017-03-19: 4 mg via INTRAVENOUS

## 2017-03-19 MED ORDER — ONDANSETRON HCL 4 MG/2ML IJ SOLN
INTRAMUSCULAR | Status: AC
  Filled 2017-03-19: qty 2

## 2017-03-19 MED ORDER — ONDANSETRON HCL 4 MG/2ML IJ SOLN: 4.0000 mg | Freq: Once | INTRAMUSCULAR | Status: DC

## 2017-03-19 MED ORDER — HYDROCORTISONE NA SUCCINATE PF 100 MG IJ SOLR
100.0000 mg | Freq: Once | INTRAMUSCULAR | Status: AC
  Administered 2017-03-19: 100 mg via INTRAVENOUS

## 2017-03-19 MED ORDER — SODIUM CHLORIDE 0.9 % IV SOLN
Freq: Once | INTRAVENOUS | Status: AC
  Administered 2017-03-19: 10:00:00 via INTRAVENOUS
  Filled 2017-03-19: qty 1000

## 2017-03-19 MED ORDER — SODIUM CHLORIDE 0.9 % IV SOLN
510.0000 mg | Freq: Once | INTRAVENOUS | Status: AC
  Administered 2017-03-19: 510 mg via INTRAVENOUS
  Filled 2017-03-19: qty 17

## 2017-03-19 MED ORDER — DIPHENHYDRAMINE HCL 50 MG/ML IJ SOLN
25.0000 mg | Freq: Once | INTRAMUSCULAR | Status: AC
  Administered 2017-03-19: 25 mg via INTRAVENOUS

## 2017-03-19 NOTE — Addendum Note (Signed)
Addended by: Randa Evens C on: 03/19/2017 10:59 AM   Modules accepted: Orders

## 2017-03-19 NOTE — Addendum Note (Signed)
Addended by: Luella Cook on: 03/19/2017 09:49 AM   Modules accepted: Orders

## 2017-03-19 NOTE — Progress Notes (Signed)
New pt eval

## 2017-03-19 NOTE — Progress Notes (Addendum)
Hematology/Oncology Consult note [facility] [phone] [phone]  Patient Care Team: [name], Hershal Coria as PCP - General (Physician Assistant)   Name of the patient: Jessica Mckay  [id number]  Jan 28, 1986    Reason for referral- anemia   Referring physician- Colbert Ewing PA-C  Date of visit: 03/19/17   History of presenting illness- patient is a 31 year old female with a past medical history significant for iron deficiency anemia for which she has been taking oral iron for about 3 months now. Her other past medical history significant for hypertension and depression. And a platelet count of 407. Since beginning of this year her hemoglobin has been between 9-10. She has evidence of chronic microcytosis. Iron studies from 03/04/2017 showed serum iron of 7, ferritin of 3 and an elevated TIBC of 523. Folate was normal at 22.3.  Patient reports having heavy periods for the last 1 year or so you're in her menstrual cycles lasts for about 4 days but she needs to change her son 3 pads almost every hour and she reports passing blood clots. No family history of any bleeding disorder. She has herself undergone cholecystectomy tubal ligation and 3 cesarean sections in the past without any significant bleeding problems. She does not report any frequent use of NSAIDs and says that she cannot take them on a regular basis because she was told that she has stomach ulcers. She has never had an EGD or colonoscopy. She had some hemorrhoidal bleed in the past and has been trying to modify her diet and take more fiber. She does report on and off diarrhea.  ECOG PS- 0  Pain scale- 6   Review of Systems  Constitutional: Positive for malaise/fatigue. Negative for chills, fever and weight loss.  HENT: Negative for congestion, ear discharge and nosebleeds.   Eyes: Negative for blurred vision.  Respiratory: Negative for cough, hemoptysis, sputum production, shortness  of breath and wheezing.   Cardiovascular: Negative for chest pain, palpitations, orthopnea and claudication.  Gastrointestinal: Positive for diarrhea. Negative for abdominal pain, blood in stool, constipation, heartburn, melena, nausea and vomiting.  Genitourinary: Negative for dysuria, flank pain, frequency, hematuria and urgency.  Musculoskeletal: Negative for back pain, joint pain and myalgias.  Skin: Negative for rash.  Neurological: Negative for dizziness, tingling, focal weakness, seizures, weakness and headaches.  Endo/Heme/Allergies: Does not bruise/bleed easily.  Psychiatric/Behavioral: Negative for depression and suicidal ideas. The patient does not have insomnia.      Allergies  Allergen Reactions  . Amoxicillin Hives, Itching and Swelling  . Morphine And Related Hives and Shortness Of Breath    Chest pains  . Penicillins Itching and Swelling    Swelling in throat  . Erythromycin Hives  . Gabapentin Other (See Comments)    Seizures   . Hydrocodone Other (See Comments)    Elevates heart rate  . Latex Hives  . Oxycodone Other (See Comments)    Elevates heart rate    Patient Active Problem List   Diagnosis Date Noted  . Iron deficiency anemia 03/19/2017  . Stomach pain 10/09/2016  . Pelvic pain in female 02/12/2016  . 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 Low grade squamous intraepithelial lesion on cytologic smear of cervix (LGSIL) 09/20/2015  . 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 Menorrhagia with regular cycle 09/05/2015  . Dysmenorrhea 09/05/2015  . Anemia 09/05/2015     Past Medical History:  Diagnosis Date  . Anemia   . Anxiety   . Arthritis   .  Chronic kidney disease    kidney stones and pyleo  . Depression   . Dysmenorrhea 09/05/2015  . 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 Hypertension    during pregnancy  . Menorrhagia with regular cycle 09/05/2015  . Mental disorder    depression  . Pelvic pain in female 02/12/2016     Past Surgical History:    Procedure Laterality Date  . CESAREAN SECTION  2002,2006,2010  . CHOLECYSTECTOMY  2007  . RENAL ARTERY STENT  2016   removed 2016 -one month after inserted  . STENT PLACEMENT RT URETER (Normal HX)    . TUBAL LIGATION      Social History   Social History  . Marital status: Single    Spouse name: N/A  . Number of children: N/A  . Years of education: N/A   Occupational History  . Not on file.   Social History Main Topics  . Smoking status: Never Smoker  . Smokeless tobacco: Never Used  . Alcohol use No  . Drug use: No  . Sexual activity: Yes    Partners: Male    Birth control/ protection: Surgical     Comment: tubal   Other Topics Concern  . Not on file   Social History Narrative  . No narrative on file     Family History  Problem Relation Age of Onset  . Asthma Daughter   . ADD / ADHD Son   . Bipolar disorder Son   . Other Son     sleeping disorder  . Autism Son   . Other Son     hole in heart; sleeping disorder  . ADD / ADHD Son   . Diabetes Father   . Hypertension Father   . Arthritis Father   . Breast cancer Maternal Aunt     breast  . Breast cancer Cousin     breast     Current Outpatient Prescriptions:  .  Cholecalciferol (VITAMIN D3 PO), Take by mouth daily., Disp: , Rfl:  .  Cyanocobalamin (VITAMIN B-12 PO), Take by mouth. Takes 2 gummies once daily, Disp: , Rfl:  .  Diclofenac Sodium 3 % GEL, Apply topically., Disp: , Rfl:  .  DULoxetine (CYMBALTA) 20 MG capsule, Take 20 mg by mouth at bedtime. , Disp: , Rfl:  .  fluticasone (FLONASE) 50 MCG/ACT nasal spray, Place 2 sprays into both nostrils daily. , Disp: , Rfl:  .  IRON PO, Take 150 mg by mouth 2 (two) times daily. , Disp: , Rfl:  .  lansoprazole (PREVACID) 30 MG capsule, Take 1 capsule (30 mg total) by mouth 2 (two) times daily before a meal., Disp: 28 capsule, Rfl: 0 .  lisinopril (PRINIVIL,ZESTRIL) 5 MG tablet, Take by mouth., Disp: , Rfl:  .  nystatin cream (MYCOSTATIN), Apply topically.,  Disp: , Rfl:  .  venlafaxine XR (EFFEXOR-XR) 75 MG 24 hr capsule, Take by mouth., Disp: , Rfl:  .  ketorolac (TORADOL) 10 MG tablet, Take 1 tablet (10 mg total) by mouth every 6 (six) hours as needed. (Patient not taking: Reported on 03/19/2017), Disp: 20 tablet, Rfl: 0   Physical exam:  Vitals:   03/19/17 0858  BP: 138/82  Pulse: 93  Resp: 18  Temp: (!) 96.2 F (35.7 C)  TempSrc: Tympanic  Weight: 216 lb 8 oz (98.2 kg)  Height: 5' (1.524 m)   Physical Exam  Constitutional: She is oriented to person, place, and time. obese and in no distress.  HENT:  Head: Normocephalic and  atraumatic.  Eyes: EOM are normal. Pupils are equal, round, and reactive to light.  Neck: Normal range of motion.  Cardiovascular: Normal rate, regular rhythm and normal heart sounds.   Pulmonary/Chest: Effort normal and breath sounds normal.  Abdominal: Soft. Bowel sounds are normal.  Neurological: She is alert and oriented to person, place, and time.  Skin: Skin is warm and dry.       CMP Latest Ref Rng & Units 04/08/2016  Glucose 65 - 99 mg/dL 89  BUN 6 - 20 mg/dL 12  Creatinine 0.57 - 1.00 mg/dL 0.86  Sodium 134 - 144 mmol/L 139  Potassium 3.5 - 5.2 mmol/L 4.3  Chloride 96 - 106 mmol/L 102  CO2 18 - 29 mmol/L 21  Calcium 8.7 - 10.2 mg/dL 9.1  Total Protein 6.0 - 8.5 g/dL 7.3  Total Bilirubin 0.0 - 1.2 mg/dL <0.2  Alkaline Phos 39 - 117 IU/L 84  AST 0 - 40 IU/L 13  ALT 0 - 32 IU/L 10   CBC Latest Ref Rng & Units 04/08/2016  WBC 3.4 - 10.8 x10E3/uL 10.6  Hematocrit 34.0 - 46.6 % 34.8  Platelets 150 - 379 x10E3/uL 308    No images are attached to the encounter.  No results found.  Assessment and plan- Patient is a 31 y.o. female referred to [place] for iron deficiency anemia likely secondary to menorragia  Recent labs showed severe iron deficiency. Her H/H has not improved despite being on oral iron. I will plan to give her feraheme 510 mg IV X 2 doses once a week starting today.  I discussed  the risks and benefits of IV iron including all but not limited to fatigue, headaches, leg swelling. Patient understands and agrees to proceed as planned. I have encouraged her to speak to her GYN to manage her menorrhagia.    I will see her back 7 weeks from now. At that time I will do other anemia workup as well including cbc, iron studies, b12, folate, TSH, retic count, haptoglobin, celiac disease panel, stool H pylori antigen and von willebrand panel to r/o bleeding disorder as a cause of menorrhagia although suspicion is low.  Family h/o colon cancer in her maternal uncle possibly in his 11's. Patient unsure. With ongoing diarrhea and prior h/o hemorrhoids, I will refer her to GI although her IDA likely from menorrhagia  Thank you for this kind referral and the opportunity to participate in the care of this patient   Visit Diagnosis 1. Iron deficiency anemia, unspecified iron deficiency anemia type     Dr. Randa Evens, MD, MPH [place] at [facility] Pager- [phone] 03/19/2017 9:30 AM    Addendum: Patient developed hypertension, nausea and her sats dropped to 70's mid way through feraheme. I will switch her to venofer. Give tylenol and benadryl as pre meds. Give test dose before full dose 200 mg  Twice weekly X 5 doses  Dr. Randa Evens, MD, MPH [place] at [facility] Pager- [phone] 03/19/2017 10:55 AM

## 2017-03-19 NOTE — Progress Notes (Unsigned)
Pt was receiving first dose of Feraheme when she complained of nausea and abdominal pain.  Infusion stopped, Dr. Janese Banks notified, BP 168/118, HR 120 O2 sats  77%.  Benadryl 25 mg IV, Solu-Cortef 100 mg IV and Zofran 4 mg IV given.  Patient improved, VS stable and pt will be monitored for 30 minutes.

## 2017-03-23 ENCOUNTER — Inpatient Hospital Stay: Payer: Medicaid Other | Attending: Oncology

## 2017-03-23 ENCOUNTER — Telehealth: Payer: Self-pay | Admitting: Pharmacist

## 2017-03-23 VITALS — BP 117/74 | HR 90 | Temp 97.8°F | Resp 18

## 2017-03-23 DIAGNOSIS — N920 Excessive and frequent menstruation with regular cycle: Secondary | ICD-10-CM | POA: Insufficient documentation

## 2017-03-23 DIAGNOSIS — D500 Iron deficiency anemia secondary to blood loss (chronic): Secondary | ICD-10-CM | POA: Diagnosis not present

## 2017-03-23 DIAGNOSIS — D509 Iron deficiency anemia, unspecified: Secondary | ICD-10-CM

## 2017-03-23 MED ORDER — SODIUM CHLORIDE 0.9 % IV SOLN
25.0000 mg | Freq: Once | INTRAVENOUS | Status: AC
  Administered 2017-03-23: 25 mg via INTRAVENOUS
  Filled 2017-03-23: qty 1.25

## 2017-03-23 MED ORDER — DIPHENHYDRAMINE HCL 25 MG PO CAPS
25.0000 mg | ORAL_CAPSULE | Freq: Every day | ORAL | Status: DC
  Administered 2017-03-23: 25 mg via ORAL
  Filled 2017-03-23: qty 1

## 2017-03-23 MED ORDER — SODIUM CHLORIDE 0.9 % IV SOLN
175.0000 mg | Freq: Once | INTRAVENOUS | Status: AC
  Administered 2017-03-23: 175 mg via INTRAVENOUS
  Filled 2017-03-23: qty 8.75

## 2017-03-23 MED ORDER — ACETAMINOPHEN 325 MG PO TABS
650.0000 mg | ORAL_TABLET | Freq: Every day | ORAL | Status: DC
  Administered 2017-03-23: 650 mg via ORAL
  Filled 2017-03-23: qty 2

## 2017-03-23 NOTE — Telephone Encounter (Signed)
Patient had reaction on 03/19/17 with Feraheme. Patient will receive Venofer today as a test dose of 25mg  then if no apparent problems, patient will receive a 175mg  dose to equal a total dose of 200mg . Patient will also receive premedications of Tyelnol and Benadryl.

## 2017-03-26 ENCOUNTER — Ambulatory Visit: Payer: Medicaid Other

## 2017-04-12 ENCOUNTER — Ambulatory Visit (INDEPENDENT_AMBULATORY_CARE_PROVIDER_SITE_OTHER): Payer: Medicaid Other | Admitting: Gastroenterology

## 2017-04-12 ENCOUNTER — Other Ambulatory Visit: Payer: Self-pay

## 2017-04-12 ENCOUNTER — Encounter: Payer: Self-pay | Admitting: Gastroenterology

## 2017-04-12 VITALS — BP 127/86 | HR 85 | Temp 98.7°F | Ht 60.0 in | Wt 224.0 lb

## 2017-04-12 DIAGNOSIS — N6452 Nipple discharge: Secondary | ICD-10-CM

## 2017-04-12 DIAGNOSIS — D500 Iron deficiency anemia secondary to blood loss (chronic): Secondary | ICD-10-CM

## 2017-04-12 NOTE — Progress Notes (Signed)
Gastroenterology Consultation  Referring Provider:     Sindy Guadeloupe, MD Primary Care Physician:  [name], PA-C Primary Gastroenterologist:  [name]     Reason for Consultation:     Iron deficiency anemia        HPI:   Jessica Mckay is a 31 y.o. y/o female referred for consultation & management of Iron deficiency anemia by Dr. Meda Coffee, Holli Humbles, PA-C.  This patient comes in today with a history of iron deficiency anemia.  The patient states that she is not having heavy menstrual periods.The patient also denies seeing any black stools or bloody stools.  There is no report of any abdominal pain, nausea, fevers or chills.  The patient was seen by hematology and multiple studies were sent off including a stool H. Pylori antigen since she was diagnosed with H. Pylori in the past and treated by eradication was not proven.   Past Medical History:  Diagnosis Date  . Anemia   . Anxiety   . Arthritis   . Chronic kidney disease    kidney stones and pyleo  . Depression   . Dysmenorrhea 09/05/2015  . 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 Hypertension    during pregnancy  . Menorrhagia with regular cycle 09/05/2015  . Mental disorder    depression  . Pelvic pain in female 02/12/2016    Past Surgical History:  Procedure Laterality Date  . CESAREAN SECTION  2002,2006,2010  . CHOLECYSTECTOMY  2007  . RENAL ARTERY STENT  2016   removed 2016 -one month after inserted  . STENT PLACEMENT RT URETER ([place] HX)    . TUBAL LIGATION      Prior to Admission medications   Medication Sig Start Date End Date Taking? Authorizing Provider  Cholecalciferol (VITAMIN D3 PO) Take by mouth daily.   Yes [provider]  Cyanocobalamin (VITAMIN B-12 PO) Take by mouth. Takes 2 gummies once daily   Yes [provider]  Diclofenac Sodium 3 % GEL Apply topically. 12/14/16 12/14/17 Yes [provider]  DULoxetine (CYMBALTA) 20 MG capsule Take 20 mg by mouth at bedtime.  08/24/16 08/24/17 Yes  [provider]  fluticasone (FLONASE) 50 MCG/ACT nasal spray Place 2 sprays into both nostrils daily.  08/24/16 08/24/17 Yes [provider]  IRON PO Take 150 mg by mouth 2 (two) times daily.    Yes [provider]  lisinopril (PRINIVIL,ZESTRIL) 5 MG tablet Take by mouth. 12/28/16 12/28/17 Yes [provider]  nystatin cream (MYCOSTATIN) Apply topically. 03/04/17 03/04/18 Yes [provider]  venlafaxine XR (EFFEXOR-XR) 75 MG 24 hr capsule Take by mouth. 01/29/17 01/29/18 Yes [provider]  ketorolac (TORADOL) 10 MG tablet Take 1 tablet (10 mg total) by mouth every 6 (six) hours as needed. Patient not taking: Reported on 03/19/2017 06/23/16   [name], NP  lansoprazole (PREVACID) 30 MG capsule Take 1 capsule (30 mg total) by mouth 2 (two) times daily before a meal. Patient not taking: Reported on 04/12/2017 10/13/16   [name], NP    Family History  Problem Relation Age of Onset  . Asthma Daughter   . ADD / ADHD Son   . Bipolar disorder Son   . Other Son        sleeping disorder  . Autism Son   . Other Son        hole in heart; sleeping disorder  . ADD / ADHD Son   . Diabetes Father   .  Hypertension Father   . Arthritis Father   . Breast cancer Maternal Aunt        breast  . Breast cancer Cousin        breast     Social History  Substance Use Topics  . Smoking status: Never Smoker  . Smokeless tobacco: Never Used  . Alcohol use No    Allergies as of 04/12/2017 - Review Complete 04/12/2017  Allergen Reaction Noted  . Amoxicillin Hives, Itching, and Swelling 09/05/2015  . Morphine and related Hives and Shortness Of Breath 07/09/2016  . Penicillins Itching and Swelling 09/05/2015  . Erythromycin Hives 09/05/2015  . Gabapentin Other (See Comments) 09/05/2015  . Hydrocodone Other (See Comments) 09/05/2015  . Latex Hives 09/05/2015  . Oxycodone Other (See Comments) 09/05/2015    Review of Systems:    All  systems reviewed and negative except where noted in HPI.   Physical Exam:  BP 127/86   Pulse 85   Temp 98.7 F (37.1 C) (Oral)   Ht 5' (1.524 m)   Wt 224 lb (101.6 kg)   BMI 43.75 kg/m  No LMP recorded. Patient is not currently having periods (Reason: Other). Psych:  Alert and cooperative. Normal mood and affect. General:   Alert,  Well-developed, well-nourished, pleasant and cooperative in NAD Head:  Normocephalic and atraumatic. Eyes:  Sclera clear, no icterus.   Conjunctiva pink. Ears:  Normal auditory acuity. Nose:  No deformity, discharge, or lesions. Mouth:  No deformity or lesions,oropharynx pink & moist. Neck:  Supple; no masses or thyromegaly. Lungs:  Respirations even and unlabored.  Clear throughout to auscultation.   No wheezes, crackles, or rhonchi. No acute distress. Heart:  Regular rate and rhythm; no murmurs, clicks, rubs, or gallops. Abdomen:  Normal bowel sounds.  No bruits.  Soft, non-tender and non-distended without masses, hepatosplenomegaly or hernias noted.  No guarding or rebound tenderness.  Negative Carnett sign.   Rectal:  Deferred.  Msk:  Symmetrical without gross deformities.  Good, equal movement & strength bilaterally. Pulses:  Normal pulses noted. Extremities:  No clubbing or edema.  No cyanosis. Neurologic:  Alert and oriented x3;  grossly normal neurologically. Skin:  Intact without significant lesions or rashes.  No jaundice. Lymph Nodes:  No significant cervical adenopathy. Psych:  Alert and cooperative. Normal mood and affect.  Imaging Studies: No results found.  Assessment and Plan:   Jessica Mckay is a 31 y.o. y/o female who has iron deficiency anemia with a low MCV.  The patient will be set up for an EGD and colonoscopy.  The patient has also been encouraged to have the labs done that were ordered back in April 2018. I have discussed risks & benefits which include, but are not limited to, bleeding, infection, perforation & drug reaction.   The patient agrees with this plan & written consent will be obtained.       [name], MD. Marval Regal   Note: This dictation was prepared with Dragon dictation along with smaller phrase technology. Any transcriptional errors that result from this process are unintentional.

## 2017-04-13 ENCOUNTER — Telehealth: Payer: Self-pay | Admitting: Gastroenterology

## 2017-04-13 ENCOUNTER — Other Ambulatory Visit: Payer: Self-pay

## 2017-04-13 DIAGNOSIS — D509 Iron deficiency anemia, unspecified: Secondary | ICD-10-CM

## 2017-04-13 MED ORDER — PEG 3350-KCL-NABCB-NACL-NASULF 236 G PO SOLR: ORAL | 0 refills | Status: DC

## 2017-04-13 NOTE — Telephone Encounter (Signed)
Pt notified rx was sent in this morning. Pt advised she had picked up the rx today.

## 2017-04-13 NOTE — Telephone Encounter (Signed)
Patient left a voice message that she needs a rx for her procedure on Friday. Please call

## 2017-04-15 NOTE — Discharge Instructions (Signed)

## 2017-04-16 ENCOUNTER — Encounter
Admission: RE | Disposition: A | Discharge status: Home / Self Care | Payer: Self-pay | Source: Ambulatory Visit | Attending: Gastroenterology

## 2017-04-16 ENCOUNTER — Ambulatory Visit: Admit: 2017-04-16 | Payer: Medicaid Other | Admitting: Gastroenterology

## 2017-04-16 ENCOUNTER — Ambulatory Visit
Admission: RE | Admit: 2017-04-16 | Discharge: 2017-04-16 | Disposition: A | Discharge status: Home / Self Care | Payer: Medicaid Other | Source: Ambulatory Visit | Attending: Gastroenterology | Admitting: Gastroenterology

## 2017-04-16 ENCOUNTER — Ambulatory Visit: Payer: Medicaid Other | Admitting: Anesthesiology

## 2017-04-16 DIAGNOSIS — Z9049 Acquired absence of other specified parts of digestive tract: Secondary | ICD-10-CM | POA: Insufficient documentation

## 2017-04-16 DIAGNOSIS — D509 Iron deficiency anemia, unspecified: Secondary | ICD-10-CM | POA: Insufficient documentation

## 2017-04-16 DIAGNOSIS — Z7951 Long term (current) use of inhaled steroids: Secondary | ICD-10-CM | POA: Insufficient documentation

## 2017-04-16 DIAGNOSIS — I10 Essential (primary) hypertension: Secondary | ICD-10-CM | POA: Insufficient documentation

## 2017-04-16 DIAGNOSIS — F419 Anxiety disorder, unspecified: Secondary | ICD-10-CM | POA: Insufficient documentation

## 2017-04-16 DIAGNOSIS — D120 Benign neoplasm of cecum: Secondary | ICD-10-CM | POA: Diagnosis not present

## 2017-04-16 DIAGNOSIS — F329 Major depressive disorder, single episode, unspecified: Secondary | ICD-10-CM | POA: Insufficient documentation

## 2017-04-16 DIAGNOSIS — Z9851 Tubal ligation status: Secondary | ICD-10-CM | POA: Diagnosis not present

## 2017-04-16 DIAGNOSIS — Z79899 Other long term (current) drug therapy: Secondary | ICD-10-CM | POA: Diagnosis not present

## 2017-04-16 DIAGNOSIS — K635 Polyp of colon: Secondary | ICD-10-CM | POA: Diagnosis not present

## 2017-04-16 DIAGNOSIS — D500 Iron deficiency anemia secondary to blood loss (chronic): Secondary | ICD-10-CM

## 2017-04-16 HISTORY — PX: ESOPHAGOGASTRODUODENOSCOPY (EGD) WITH PROPOFOL: SHX5813

## 2017-04-16 HISTORY — DX: Migraine, unspecified, not intractable, without status migrainosus: G43.909

## 2017-04-16 HISTORY — PX: COLONOSCOPY WITH PROPOFOL: SHX5780

## 2017-04-16 HISTORY — PX: POLYPECTOMY: SHX5525

## 2017-04-16 SURGERY — COLONOSCOPY WITH PROPOFOL
Anesthesia: Choice

## 2017-04-16 SURGERY — COLONOSCOPY WITH PROPOFOL
Anesthesia: Monitor Anesthesia Care

## 2017-04-16 MED ORDER — GLYCOPYRROLATE 0.2 MG/ML IJ SOLN
INTRAMUSCULAR | Status: DC | PRN
  Administered 2017-04-16: 0.2 mg via INTRAVENOUS

## 2017-04-16 MED ORDER — PROPOFOL 10 MG/ML IV BOLUS
INTRAVENOUS | Status: DC | PRN
  Administered 2017-04-16 (×4): 20 mg via INTRAVENOUS
  Administered 2017-04-16: 30 mg via INTRAVENOUS
  Administered 2017-04-16 (×2): 20 mg via INTRAVENOUS
  Administered 2017-04-16: 30 mg via INTRAVENOUS
  Administered 2017-04-16: 80 mg via INTRAVENOUS
  Administered 2017-04-16 (×3): 20 mg via INTRAVENOUS

## 2017-04-16 MED ORDER — LIDOCAINE HCL (CARDIAC) 20 MG/ML IV SOLN
INTRAVENOUS | Status: DC | PRN
  Administered 2017-04-16: 50 mg via INTRAVENOUS

## 2017-04-16 MED ORDER — LACTATED RINGERS IV SOLN
INTRAVENOUS | Status: DC
  Administered 2017-04-16: 10:00:00 via INTRAVENOUS

## 2017-04-16 MED ORDER — STERILE WATER FOR IRRIGATION IR SOLN
Status: DC | PRN
  Administered 2017-04-16: 11:00:00

## 2017-04-16 SURGICAL SUPPLY — 35 items

## 2017-04-16 NOTE — Transfer of Care (Signed)
Immediate Anesthesia Transfer of Care Note  Patient: Jessica Mckay  Procedure(s) Performed: Procedure(s) with comments: COLONOSCOPY WITH PROPOFOL (N/A) ESOPHAGOGASTRODUODENOSCOPY (EGD) WITH PROPOFOL (N/A) - Latex Allergy POLYPECTOMY (N/A)  Patient Location: PACU  Anesthesia Type: MAC  Level of Consciousness: awake, alert  and patient cooperative  Airway and Oxygen Therapy: Patient Spontanous Breathing and Patient connected to supplemental oxygen  Post-op Assessment: Post-op Vital signs reviewed, Patient's Cardiovascular Status Stable, Respiratory Function Stable, Patent Airway and No signs of Nausea or vomiting  Post-op Vital Signs: Reviewed and stable  Complications: No apparent anesthesia complications

## 2017-04-16 NOTE — Op Note (Signed)
[facility] Gastroenterology Patient Name: Jessica Mckay Procedure Date: 04/16/2017 10:33 AM MRN: [id number] Account #: [id number] Date of Birth: Apr 08, 1986 Admit Type: Outpatient Age: 31 Room: [facility] [place] OR ROOM 01 Gender: Female Note Status: Finalized Procedure:            Colonoscopy Indications:          Iron deficiency anemia Providers:            [name] MD, MD Medicines:            Propofol per Anesthesia Complications:        No immediate complications. Procedure:            Pre-Anesthesia Assessment:                       - Prior to the procedure, a History and Physical was                        performed, and patient medications and allergies were                        reviewed. The patient's tolerance of previous                        anesthesia was also reviewed. The risks and benefits of                        the procedure and the sedation options and risks were                        discussed with the patient. All questions were                        answered, and informed consent was obtained. Prior                        Anticoagulants: The patient has taken no previous                        anticoagulant or antiplatelet agents. ASA Grade                        Assessment: II - A patient with mild systemic disease.                        After reviewing the risks and benefits, the patient was                        deemed in satisfactory condition to undergo the                        procedure.                       After obtaining informed consent, the colonoscope was                        passed under direct vision. Throughout the procedure,                        the patient's blood pressure, pulse, and  oxygen                        saturations were monitored continuously. The [place] [id number]) was introduced through the                        anus and advanced to the the cecum, identified by                    appendiceal orifice and ileocecal valve. The                        colonoscopy was performed without difficulty. The                        patient tolerated the procedure well. The quality of                        the bowel preparation was poor. Findings:      The perianal and digital rectal examinations were normal.      A 3 mm polyp was found in the cecum. The polyp was sessile. The polyp       was removed with a cold snare. Resection and retrieval were complete. Impression:           - Preparation of the colon was poor.                       - One 3 mm polyp in the cecum, removed with a cold                        snare. Resected and retrieved. Recommendation:       - Discharge patient to home.                       - Resume previous diet.                       - Continue present medications.                       - Await pathology results.                       - Repeat colonoscopy in 5 years if polyp adenoma and 10                        years if hyperplastic Procedure Code(s):    --- Professional ---                       [phone], Colonoscopy, flexible; with removal of tumor(s),                        polyp(s), or other lesion(s) by snare technique Diagnosis Code(s):    --- Professional ---                       D50.9, Iron deficiency anemia, unspecified  D12.0, Benign neoplasm of cecum CPT copyright 2016 American Medical Association. All rights reserved. The codes documented in this report are preliminary and upon coder review may  be revised to meet current compliance requirements. [name] MD, MD 04/16/2017 11:02:16 AM This report has been signed electronically. Number of Addenda: 0 Note Initiated On: 04/16/2017 10:33 AM Scope Withdrawal Time: 0 hours 8 minutes 32 seconds  Total Procedure Duration: 0 hours 10 minutes 49 seconds       [facility]

## 2017-04-16 NOTE — Anesthesia Postprocedure Evaluation (Signed)
Anesthesia Post Note  Patient: Jessica Mckay  Procedure(s) Performed: Procedure(s) (LRB): COLONOSCOPY WITH PROPOFOL (N/A) ESOPHAGOGASTRODUODENOSCOPY (EGD) WITH PROPOFOL (N/A) POLYPECTOMY (N/A)  Patient location during evaluation: PACU Anesthesia Type: MAC Level of consciousness: awake and alert Pain management: pain level controlled Vital Signs Assessment: post-procedure vital signs reviewed and stable Respiratory status: spontaneous breathing Cardiovascular status: blood pressure returned to baseline Postop Assessment: no headache Anesthetic complications: no    Jaci Standard, III,  Walt Geathers D

## 2017-04-16 NOTE — Op Note (Signed)
[facility] Gastroenterology Patient Name: Jessica Mckay Procedure Date: 04/16/2017 10:33 AM MRN: [id number] Account #: [id number] Date of Birth: 09-29-1986 Admit Type: Outpatient Age: 31 Room: [facility] OR ROOM 01 Gender: Female Note Status: Finalized Procedure:            Upper GI endoscopy Indications:          Iron deficiency anemia Providers:            [name] MD, MD Referring MD:         [name] (Referring MD) Medicines:            Propofol per Anesthesia Complications:        No immediate complications. Procedure:            Pre-Anesthesia Assessment:                       - Prior to the procedure, a History and Physical was                        performed, and patient medications and allergies were                        reviewed. The patient's tolerance of previous                        anesthesia was also reviewed. The risks and benefits of                        the procedure and the sedation options and risks were                        discussed with the patient. All questions were                        answered, and informed consent was obtained. Prior                        Anticoagulants: The patient has taken no previous                        anticoagulant or antiplatelet agents. ASA Grade                        Assessment: II - A patient with mild systemic disease.                        After reviewing the risks and benefits, the patient was                        deemed in satisfactory condition to undergo the                        procedure.                       After obtaining informed consent, the endoscope was                        passed under direct vision. Throughout the procedure,  the patient's blood pressure, pulse, and oxygen                        saturations were monitored continuously. The Olympus                        GIF H180J Endoscope (X#:[id number]) was introduced through      the mouth, and advanced to the second part of duodenum.                        The upper GI endoscopy was accomplished without                        difficulty. The patient tolerated the procedure well. Findings:      The examined esophagus was normal.      The entire examined stomach was normal.      The examined duodenum was normal. Biopsies were taken with a cold       forceps for histology. Impression:           - Normal esophagus.                       - Normal stomach.                       - Normal examined duodenum. Biopsied. Recommendation:       - Discharge patient to home.                       - Resume previous diet.                       - Continue present medications.                       - Await pathology results.                       - Perform a colonoscopy today. Procedure Code(s):    --- Professional ---                       [phone], Esophagogastroduodenoscopy, flexible, transoral;                        with biopsy, single or multiple Diagnosis Code(s):    --- Professional ---                       D50.9, Iron deficiency anemia, unspecified CPT copyright 2016 American Medical Association. All rights reserved. The codes documented in this report are preliminary and upon coder review may  be revised to meet current compliance requirements. [name] MD, MD 04/16/2017 10:47:39 AM This report has been signed electronically. Number of Addenda: 0 Note Initiated On: 04/16/2017 10:33 AM      [facility]

## 2017-04-16 NOTE — Anesthesia Procedure Notes (Signed)
Procedure Name: MAC Performed by: Mayme Genta Pre-anesthesia Checklist: Patient identified, Emergency Drugs available, Suction available, Timeout performed and Patient being monitored Patient Re-evaluated:Patient Re-evaluated prior to inductionOxygen Delivery Method: Nasal cannula Placement Confirmation: positive ETCO2

## 2017-04-16 NOTE — H&P (Signed)
Jessica Lame, MD [name] [name], [name] Phone:[phone] Fax : [phone]  Primary Care Physician:  [name], PA-C Primary Gastroenterologist:  [name]  Pre-Procedure History & Physical: HPI:  Jessica Mckay is a 31 y.o. female is here for an endoscopy and colonoscopy.   Past Medical History:  Diagnosis Date  . Anemia   . Anxiety   . Arthritis    hands and feet  . Chronic kidney disease    kidney stones and pyleo  . Depression   . Dysmenorrhea 09/05/2015  . 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 Hypertension    during pregnancy  . Menorrhagia with regular cycle 09/05/2015  . Mental disorder    depression  . Migraines   . Pelvic pain in female 02/12/2016    Past Surgical History:  Procedure Laterality Date  . CESAREAN SECTION  2002,2006,2010  . CHOLECYSTECTOMY  2007  . RENAL ARTERY STENT  2016   removed 2016 -one month after inserted  . STENT PLACEMENT RT URETER ([place] HX)    . TUBAL LIGATION      Prior to Admission medications   Medication Sig Start Date End Date Taking? Authorizing Provider  Cholecalciferol (VITAMIN D3 PO) Take by mouth daily.   Yes [provider]  Cyanocobalamin (VITAMIN B-12 PO) Take by mouth. Takes 2 gummies once daily   Yes [provider]  DULoxetine (CYMBALTA) 20 MG capsule Take 20 mg by mouth at bedtime.  08/24/16 08/24/17 Yes [provider]  fluticasone (FLONASE) 50 MCG/ACT nasal spray Place 2 sprays into both nostrils daily.  08/24/16 08/24/17 Yes [provider]  IRON PO Take 150 mg by mouth 2 (two) times daily.    Yes [provider]  ketorolac (TORADOL) 10 MG tablet Take 1 tablet (10 mg total) by mouth every 6 (six) hours as needed. 06/23/16  Yes Derrek Monaco A, NP  lisinopril (PRINIVIL,ZESTRIL) 5 MG tablet Take by mouth. 12/28/16 12/28/17 Yes [provider]  nystatin cream (MYCOSTATIN) Apply topically. 03/04/17 03/04/18 Yes [provider]    polyethylene glycol (GOLYTELY) 236 g solution Drink one 8 oz glass every 20 mins until stools are clear. 04/13/17  Yes Jessica Lame, MD  venlafaxine XR (EFFEXOR-XR) 75 MG 24 hr capsule Take by mouth. 01/29/17 01/29/18 Yes [provider]  Diclofenac Sodium 3 % GEL Apply topically. 12/14/16 12/14/17  [provider]  lansoprazole (PREVACID) 30 MG capsule Take 1 capsule (30 mg total) by mouth 2 (two) times daily before a meal. Patient not taking: Reported on 04/16/2017 10/13/16   [name], NP    Allergies as of 04/13/2017 - Review Complete 04/13/2017  Allergen Reaction Noted  . Amoxicillin Hives, Itching, and Swelling 09/05/2015  . Morphine and related Hives and Shortness Of Breath 07/09/2016  . Penicillins Itching and Swelling 09/05/2015  . Erythromycin Hives 09/05/2015  . Gabapentin Other (See Comments) 09/05/2015  . Hydrocodone Other (See Comments) 09/05/2015  . Latex Hives 09/05/2015  . Oxycodone Other (See Comments) 09/05/2015    Family History  Problem Relation Age of Onset  . Asthma Daughter   . ADD / ADHD Son   . Bipolar disorder Son   . Other Son        sleeping disorder  . Autism Son   . Other Son        hole in heart; sleeping disorder  . ADD / ADHD Son   . Diabetes Father   . Hypertension Father   .  Arthritis Father   . Breast cancer Maternal Aunt        breast  . Breast cancer Cousin        breast    Social History   Social History  . Marital status: Single    Spouse name: N/A  . Number of children: N/A  . Years of education: N/A   Occupational History  . Not on file.   Social History Main Topics  . Smoking status: Never Smoker  . Smokeless tobacco: Never Used  . Alcohol use No  . Drug use: No  . Sexual activity: Yes    Partners: Male    Birth control/ protection: Surgical     Comment: tubal   Other Topics Concern  . Not on file   Social History Narrative  . No narrative on file    Review of Systems: See HPI,  otherwise negative ROS  Physical Exam: Ht 5' (1.524 m)   Wt 220 lb (99.8 kg)   LMP 03/28/2017 (Approximate) Comment: pregnancy test negative  BMI 42.97 kg/m  General:   Alert,  pleasant and cooperative in NAD Head:  Normocephalic and atraumatic. Neck:  Supple; no masses or thyromegaly. Lungs:  Clear throughout to auscultation.    Heart:  Regular rate and rhythm. Abdomen:  Soft, nontender and nondistended. Normal bowel sounds, without guarding, and without rebound.   Neurologic:  Alert and  oriented x4;  grossly normal neurologically.  Impression/Plan: [name] is here for an endoscopy and colonoscopy to be performed for Anemia  Risks, benefits, limitations, and alternatives regarding  endoscopy and colonoscopy have been reviewed with the patient.  Questions have been answered.  All parties agreeable.   Jessica Lame, MD  04/16/2017, 9:59 AM

## 2017-04-16 NOTE — Anesthesia Preprocedure Evaluation (Signed)
Anesthesia Evaluation  Patient identified by MRN, date of birth, ID band Patient awake    Reviewed: Allergy & Precautions, H&P , NPO status , Patient's Chart, lab work & pertinent test results  Airway Mallampati: II  TM Distance: >3 FB Neck ROM: full    Dental no notable dental hx.    Pulmonary neg pulmonary ROS,    Pulmonary exam normal        Cardiovascular hypertension, On Medications Normal cardiovascular exam     Neuro/Psych    GI/Hepatic negative GI ROS, Neg liver ROS,   Endo/Other  negative endocrine ROS  Renal/GU      Musculoskeletal   Abdominal   Peds  Hematology   Anesthesia Other Findings   Reproductive/Obstetrics negative OB ROS                             Anesthesia Physical Anesthesia Plan  ASA: II  Anesthesia Plan: MAC   Post-op Pain Management:    Induction:   Airway Management Planned:   Additional Equipment:   Intra-op Plan:   Post-operative Plan:   Informed Consent: I have reviewed the patients History and Physical, chart, labs and discussed the procedure including the risks, benefits and alternatives for the proposed anesthesia with the patient or authorized representative who has indicated his/her understanding and acceptance.     Plan Discussed with:   Anesthesia Plan Comments:         Anesthesia Quick Evaluation

## 2017-04-20 ENCOUNTER — Encounter: Payer: Self-pay | Admitting: Gastroenterology

## 2017-04-27 ENCOUNTER — Encounter: Payer: Self-pay | Admitting: Gastroenterology

## 2017-05-07 ENCOUNTER — Inpatient Hospital Stay (HOSPITAL_BASED_OUTPATIENT_CLINIC_OR_DEPARTMENT_OTHER): Payer: Medicaid Other | Admitting: Oncology

## 2017-05-07 ENCOUNTER — Inpatient Hospital Stay: Payer: Medicaid Other | Attending: Oncology

## 2017-05-07 ENCOUNTER — Other Ambulatory Visit: Payer: Self-pay

## 2017-05-07 ENCOUNTER — Encounter: Payer: Self-pay | Admitting: Oncology

## 2017-05-07 VITALS — BP 129/94 | HR 63 | Temp 95.7°F | Resp 18 | Wt 220.4 lb

## 2017-05-07 DIAGNOSIS — D509 Iron deficiency anemia, unspecified: Secondary | ICD-10-CM

## 2017-05-07 DIAGNOSIS — Z79899 Other long term (current) drug therapy: Secondary | ICD-10-CM | POA: Insufficient documentation

## 2017-05-07 DIAGNOSIS — Z87442 Personal history of urinary calculi: Secondary | ICD-10-CM | POA: Insufficient documentation

## 2017-05-07 DIAGNOSIS — N189 Chronic kidney disease, unspecified: Secondary | ICD-10-CM

## 2017-05-07 DIAGNOSIS — Z880 Allergy status to penicillin: Secondary | ICD-10-CM | POA: Diagnosis not present

## 2017-05-07 DIAGNOSIS — F329 Major depressive disorder, single episode, unspecified: Secondary | ICD-10-CM

## 2017-05-07 DIAGNOSIS — N644 Mastodynia: Secondary | ICD-10-CM | POA: Insufficient documentation

## 2017-05-07 DIAGNOSIS — R5383 Other fatigue: Secondary | ICD-10-CM | POA: Insufficient documentation

## 2017-05-07 DIAGNOSIS — I129 Hypertensive chronic kidney disease with stage 1 through stage 4 chronic kidney disease, or unspecified chronic kidney disease: Secondary | ICD-10-CM | POA: Diagnosis not present

## 2017-05-07 DIAGNOSIS — N6452 Nipple discharge: Secondary | ICD-10-CM | POA: Diagnosis not present

## 2017-05-07 DIAGNOSIS — Z9049 Acquired absence of other specified parts of digestive tract: Secondary | ICD-10-CM

## 2017-05-07 DIAGNOSIS — N920 Excessive and frequent menstruation with regular cycle: Secondary | ICD-10-CM | POA: Insufficient documentation

## 2017-05-07 LAB — CBC
HEMATOCRIT: 37.7 % (ref 35.0–47.0)
HEMOGLOBIN: 12.4 g/dL (ref 12.0–16.0)
MCH: 25.8 pg — AB (ref 26.0–34.0)
MCHC: 32.9 g/dL (ref 32.0–36.0)
MCV: 78.5 fL — AB (ref 80.0–100.0)
Platelets: 320 10*3/uL (ref 150–440)
RBC: 4.8 MIL/uL (ref 3.80–5.20)
RDW: 24.1 % — AB (ref 11.5–14.5)
WBC: 9.2 10*3/uL (ref 3.6–11.0)

## 2017-05-07 LAB — FOLATE: Folate: 17.6 ng/mL (ref >5.9)

## 2017-05-07 LAB — IRON AND TIBC
Iron: 34 ug/dL (ref 28–170)
SATURATION RATIOS: 7 % — AB (ref 10.4–31.8)
TIBC: 474 ug/dL — ABNORMAL HIGH (ref 250–450)
UIBC: 440 ug/dL

## 2017-05-07 LAB — RETICULOCYTES
RBC.: 4.88 MIL/uL (ref 3.80–5.20)
RETIC COUNT ABSOLUTE: 68.3 10*3/uL (ref 19.0–183.0)
RETIC CT PCT: 1.4 % (ref 0.4–3.1)

## 2017-05-07 LAB — TSH: TSH: 3.426 u[IU]/mL (ref 0.350–4.500)

## 2017-05-07 LAB — VITAMIN B12: VITAMIN B 12: 299 pg/mL (ref 180–914)

## 2017-05-07 LAB — FERRITIN: Ferritin: 7 ng/mL — ABNORMAL LOW (ref 11–307)

## 2017-05-07 NOTE — Progress Notes (Signed)
Hematology/Oncology Consult note [facility]  [phone] [phone]  Patient Care Team: Lovett Sox as PCP - General (Physician Assistant)   Name of the patient: Jessica Mckay  [id number]  [birth year]   Date of visit: 05/07/17  Diagnosis- iron deficiency anemia  Chief complaint/ Reason for visit- routine f/u  Heme/Onc history: patient is a 31 year old female with a past medical history significant for iron deficiency anemia for which she has been taking oral iron for about 3 months now. Her other past medical history significant for hypertension and depression. And a platelet count of 407. Since beginning of this year her hemoglobin has been between 9-10. She has evidence of chronic microcytosis. Iron studies from 03/04/2017 showed serum iron of 7, ferritin of 3 and an elevated TIBC of 523. Folate was normal at 22.3.  Patient reports having heavy periods for the last 1 year or so you're in her menstrual cycles lasts for about 4 days but she needs to change her son 3 pads almost every hour and she reports passing blood clots. No family history of any bleeding disorder. She has herself undergone cholecystectomy tubal ligation and 3 cesarean sections in the past without any significant bleeding problems. She does not report any frequent use of NSAIDs and says that she cannot take them on a regular basis because she was told that she has stomach ulcers. She was seen by GI and underwent EGD and colonoscopy which was unremarkable  Interval history- states that her fatigue has not improved significantly. Menstrual cycles last for 3 days and have improved. Reports clear/ whitich discharge on and off from right nipple. Reports soreness in her right breast  ECOG PS- 1 Pain scale- 0  Review of systems- Review of Systems  Constitutional: Negative for chills, fever, malaise/fatigue and weight loss.  HENT: Negative for congestion, ear discharge  and nosebleeds.   Eyes: Negative for blurred vision.  Respiratory: Negative for cough, hemoptysis, sputum production, shortness of breath and wheezing.   Cardiovascular: Negative for chest pain, palpitations, orthopnea and claudication.  Gastrointestinal: Negative for abdominal pain, blood in stool, constipation, diarrhea, heartburn, melena, nausea and vomiting.  Genitourinary: Negative for dysuria, flank pain, frequency, hematuria and urgency.  Musculoskeletal: Negative for back pain, joint pain and myalgias.  Skin: Negative for rash.  Neurological: Negative for dizziness, tingling, focal weakness, seizures, weakness and headaches.  Endo/Heme/Allergies: Does not bruise/bleed easily.  Psychiatric/Behavioral: Negative for depression and suicidal ideas. The patient does not have insomnia.     Breast exam performed in sitting and lying down position. No palpable breast masses in either breast. No palpable bilateral axillary adenopathy. No evidence of nipple discharge on todays exam  Allergies  Allergen Reactions  . Amoxicillin Hives, Itching and Swelling  . Morphine And Related Hives and Shortness Of Breath    Chest pains  . Penicillins Itching and Swelling    Swelling in throat  . Erythromycin Hives  . Gabapentin Other (See Comments)    Seizures   . Hydrocodone Other (See Comments)    Elevates heart rate  . Latex Hives  . Oxycodone Other (See Comments)    Elevates heart rate     Past Medical History:  Diagnosis Date  . Anemia   . Anxiety   . Arthritis    hands and feet  . Chronic kidney disease    kidney stones and pyleo  . Depression   . Dysmenorrhea 09/05/2015  . 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 Hypertension  during pregnancy  . Menorrhagia with regular cycle 09/05/2015  . Mental disorder    depression  . Migraines   . Pelvic pain in female 02/12/2016     Past Surgical History:  Procedure Laterality Date  . CESAREAN SECTION  2002,2006,2010  . CHOLECYSTECTOMY  2007    . COLONOSCOPY WITH PROPOFOL N/A 04/16/2017   Procedure: COLONOSCOPY WITH PROPOFOL;  Surgeon: [name], MD;  Location: [place];  Service: Endoscopy;  Laterality: N/A;  . ESOPHAGOGASTRODUODENOSCOPY (EGD) WITH PROPOFOL N/A 04/16/2017   Procedure: ESOPHAGOGASTRODUODENOSCOPY (EGD) WITH PROPOFOL;  Surgeon: [name], MD;  Location: [place];  Service: Endoscopy;  Laterality: N/A;  Latex Allergy  . POLYPECTOMY N/A 04/16/2017   Procedure: POLYPECTOMY;  Surgeon: [name], MD;  Location: [place];  Service: Endoscopy;  Laterality: N/A;  . RENAL ARTERY STENT  2016   removed 2016 -one month after inserted  . STENT PLACEMENT RT URETER ([place] HX)    . TUBAL LIGATION      Social History   Social History  . Marital status: Single    Spouse name: N/A  . Number of children: N/A  . Years of education: N/A   Occupational History  . Not on file.   Social History Main Topics  . Smoking status: Never Smoker  . Smokeless tobacco: Never Used  . Alcohol use No  . Drug use: No  . Sexual activity: Yes    Partners: Male    Birth control/ protection: Surgical     Comment: tubal   Other Topics Concern  . Not on file   Social History Narrative  . No narrative on file    Family History  Problem Relation Age of Onset  . Asthma Daughter   . ADD / ADHD Son   . Bipolar disorder Son   . Other Son        sleeping disorder  . Autism Son   . Other Son        hole in heart; sleeping disorder  . ADD / ADHD Son   . Diabetes Father   . Hypertension Father   . Arthritis Father   . Breast cancer Maternal Aunt        breast  . Breast cancer Cousin        breast     Current Outpatient Prescriptions:  .  Cholecalciferol (VITAMIN D3 PO), Take by mouth daily., Disp: , Rfl:  .  Cyanocobalamin (VITAMIN B-12 PO), Take by mouth. Takes 2 gummies once daily, Disp: , Rfl:  .  Diclofenac Sodium 3 % GEL, Apply topically., Disp: , Rfl:  .  DULoxetine (CYMBALTA) 20 MG  capsule, Take 20 mg by mouth at bedtime. , Disp: , Rfl:  .  fluticasone (FLONASE) 50 MCG/ACT nasal spray, Place 2 sprays into both nostrils daily. , Disp: , Rfl:  .  IRON PO, Take 150 mg by mouth 2 (two) times daily. , Disp: , Rfl:  .  ketorolac (TORADOL) 10 MG tablet, Take 1 tablet (10 mg total) by mouth every 6 (six) hours as needed., Disp: 20 tablet, Rfl: 0 .  lansoprazole (PREVACID) 30 MG capsule, Take 1 capsule (30 mg total) by mouth 2 (two) times daily before a meal. (Patient not taking: Reported on 04/16/2017), Disp: 28 capsule, Rfl: 0 .  lisinopril (PRINIVIL,ZESTRIL) 5 MG tablet, Take by mouth., Disp: , Rfl:  .  nystatin cream (MYCOSTATIN), Apply topically., Disp: , Rfl:  .  polyethylene glycol (GOLYTELY) 236 g solution, Drink  one 8 oz glass every 20 mins until stools are clear., Disp: 4000 mL, Rfl: 0 .  venlafaxine XR (EFFEXOR-XR) 75 MG 24 hr capsule, Take by mouth., Disp: , Rfl:   Physical exam:  Vitals:   05/07/17 1045  BP: (!) 129/94  Pulse: 63  Resp: 18  Temp: (!) 95.7 F (35.4 C)  TempSrc: Tympanic  Weight: 220 lb 6.4 oz (100 kg)   Physical Exam  Constitutional: She is oriented to person, place, and time and well-developed, well-nourished, and in no distress.  HENT:  Head: Normocephalic and atraumatic.  Eyes: EOM are normal. Pupils are equal, round, and reactive to light.  Neck: Normal range of motion.  Cardiovascular: Normal rate, regular rhythm and normal heart sounds.   Pulmonary/Chest: Effort normal and breath sounds normal.  Abdominal: Soft. Bowel sounds are normal.  Neurological: She is alert and oriented to person, place, and time.  Skin: Skin is warm and dry.     CMP Latest Ref Rng & Units 04/08/2016  Glucose 65 - 99 mg/dL 89  BUN 6 - 20 mg/dL 12  Creatinine 0.57 - 1.00 mg/dL 0.86  Sodium 134 - 144 mmol/L 139  Potassium 3.5 - 5.2 mmol/L 4.3  Chloride 96 - 106 mmol/L 102  CO2 18 - 29 mmol/L 21  Calcium 8.7 - 10.2 mg/dL 9.1  Total Protein 6.0 - 8.5 g/dL  7.3  Total Bilirubin 0.0 - 1.2 mg/dL <0.2  Alkaline Phos 39 - 117 IU/L 84  AST 0 - 40 IU/L 13  ALT 0 - 32 IU/L 10   CBC Latest Ref Rng & Units 05/07/2017  WBC 3.6 - 11.0 K/uL 9.2  Hemoglobin 12.0 - 16.0 g/dL 12.4  Hematocrit 35.0 - 47.0 % 37.7  Platelets 150 - 440 K/uL 320     Assessment and plan- Patient is a 31 y.o. female referred for iron deficiency anemia  1. IDA- s/p 2 doses of feraheme. Hb significantly improved to 12.7. Iron studies still show iron deficiency. Will repeat levels in 3 months and decide about more IV iron. She will see me in 6 months  2. Right breast soreness- no palpable masses. Will shcedule mamogram of right breast. She has had previous mammogram for similar reasons and was reportedly normal.   3. Nipple discharge- none evident on todays exam. We will touch base with her pcp Colbert Ewing PA regarding this. Referral to endocrinology can be considered by her if need be.   Visit Diagnosis 1. Nipple discharge in female   2. Breast tenderness in female   3. Iron deficiency anemia, unspecified iron deficiency anemia type      Dr. Randa Evens, MD, MPH [place] at [facility] Pager- [phone] 05/07/2017  1:13 PM

## 2017-05-07 NOTE — Progress Notes (Signed)
Iron studies still low but H/H normal. Reassess in 3 months and decide about IV iron at that time. Please inform patient

## 2017-05-07 NOTE — Progress Notes (Signed)
Here for follow up

## 2017-05-08 LAB — VON WILLEBRAND PANEL
COAGULATION FACTOR VIII: 160 % (ref 57–163)
RISTOCETIN CO-FACTOR, PLASMA: 164 % (ref 50–200)
Von Willebrand Antigen, Plasma: 187 % (ref 50–200)

## 2017-05-08 LAB — HAPTOGLOBIN: HAPTOGLOBIN: 200 mg/dL (ref 34–200)

## 2017-05-08 LAB — COAG STUDIES INTERP REPORT

## 2017-05-10 ENCOUNTER — Telehealth: Payer: Self-pay | Admitting: *Deleted

## 2017-05-10 NOTE — Telephone Encounter (Signed)
Called pt to tell her the iron levels ar elow but the hgb and hct is normal so as long as hgb and hct are normal we will hold off on giving iron infusions. Will check blood work again in 3 months with cbc, ferritin, IIBC and decide from there.  Pt agreeable and I will send message to scheduling staff to make new appt.

## 2017-05-10 NOTE — Telephone Encounter (Signed)
-----   Message from Sindy Guadeloupe, MD sent at 05/07/2017 11:54 AM EDT ----- Iron studies still low but H/H normal. Reassess in 3 months and decide about IV iron at that time. Please inform patient

## 2017-05-11 LAB — CELIAC DISEASE PANEL
Endomysial Ab, IgA: NEGATIVE
IgA: 141 mg/dL (ref 87–352)
Tissue Transglutaminase Ab, IgA: <2 U/mL (ref 0–3)

## 2017-05-13 ENCOUNTER — Inpatient Hospital Stay
Admission: RE | Admit: 2017-05-13 | Discharge: 2017-05-13 | Disposition: A | Discharge status: Home / Self Care | Payer: Self-pay | Source: Ambulatory Visit | Attending: *Deleted | Admitting: *Deleted

## 2017-05-13 ENCOUNTER — Other Ambulatory Visit: Payer: Self-pay | Admitting: *Deleted

## 2017-05-13 DIAGNOSIS — Z9289 Personal history of other medical treatment: Secondary | ICD-10-CM

## 2017-05-27 ENCOUNTER — Ambulatory Visit
Admission: RE | Admit: 2017-05-27 | Discharge: 2017-05-27 | Disposition: A | Discharge status: Home / Self Care | Payer: Medicaid Other | Source: Ambulatory Visit | Attending: Oncology | Admitting: Oncology

## 2017-05-27 ENCOUNTER — Other Ambulatory Visit: Payer: Self-pay | Admitting: Oncology

## 2017-05-27 DIAGNOSIS — N6452 Nipple discharge: Secondary | ICD-10-CM | POA: Diagnosis present

## 2017-05-27 DIAGNOSIS — N644 Mastodynia: Secondary | ICD-10-CM

## 2017-06-01 ENCOUNTER — Telehealth: Payer: Self-pay | Admitting: Obstetrics and Gynecology

## 2017-06-08 ENCOUNTER — Encounter: Payer: Self-pay | Admitting: Adult Health

## 2017-06-15 ENCOUNTER — Other Ambulatory Visit: Payer: Self-pay | Admitting: Orthopedic Surgery

## 2017-06-15 DIAGNOSIS — M5442 Lumbago with sciatica, left side: Principal | ICD-10-CM

## 2017-06-15 DIAGNOSIS — G8929 Other chronic pain: Secondary | ICD-10-CM

## 2017-06-22 ENCOUNTER — Ambulatory Visit
Admission: RE | Admit: 2017-06-22 | Discharge: 2017-06-22 | Disposition: A | Discharge status: Home / Self Care | Payer: Medicaid Other | Source: Ambulatory Visit | Attending: Orthopedic Surgery | Admitting: Orthopedic Surgery

## 2017-06-22 DIAGNOSIS — M5126 Other intervertebral disc displacement, lumbar region: Secondary | ICD-10-CM | POA: Insufficient documentation

## 2017-06-22 DIAGNOSIS — M47897 Other spondylosis, lumbosacral region: Secondary | ICD-10-CM | POA: Insufficient documentation

## 2017-06-22 DIAGNOSIS — M5136 Other intervertebral disc degeneration, lumbar region: Secondary | ICD-10-CM | POA: Insufficient documentation

## 2017-06-22 DIAGNOSIS — G8929 Other chronic pain: Secondary | ICD-10-CM | POA: Insufficient documentation

## 2017-06-22 DIAGNOSIS — M5442 Lumbago with sciatica, left side: Secondary | ICD-10-CM | POA: Insufficient documentation

## 2017-07-07 NOTE — Telephone Encounter (Signed)
Pt is trying to raise funds for tubal reversal. Pt informed that Medicaid does NOT cover reversal, but if incision scarring that may be endometriosis is causing significant pain, removal could be a covered procedure.  Pt to decide her preferred tx.

## 2017-08-06 ENCOUNTER — Other Ambulatory Visit: Payer: Medicaid Other

## 2017-08-06 ENCOUNTER — Ambulatory Visit: Payer: Medicaid Other | Admitting: Oncology

## 2017-08-12 ENCOUNTER — Inpatient Hospital Stay: Payer: Medicaid Other | Attending: Oncology | Admitting: Oncology

## 2017-08-12 ENCOUNTER — Inpatient Hospital Stay: Payer: Medicaid Other

## 2017-08-12 ENCOUNTER — Encounter: Payer: Self-pay | Admitting: Oncology

## 2017-08-12 ENCOUNTER — Other Ambulatory Visit: Payer: Self-pay

## 2017-08-12 VITALS — BP 132/84 | HR 102 | Temp 98.7°F | Ht 60.0 in | Wt 233.9 lb

## 2017-08-12 DIAGNOSIS — R000 Tachycardia, unspecified: Secondary | ICD-10-CM

## 2017-08-12 DIAGNOSIS — N920 Excessive and frequent menstruation with regular cycle: Secondary | ICD-10-CM

## 2017-08-12 DIAGNOSIS — D509 Iron deficiency anemia, unspecified: Secondary | ICD-10-CM

## 2017-08-12 DIAGNOSIS — I129 Hypertensive chronic kidney disease with stage 1 through stage 4 chronic kidney disease, or unspecified chronic kidney disease: Secondary | ICD-10-CM | POA: Diagnosis not present

## 2017-08-12 DIAGNOSIS — Z9049 Acquired absence of other specified parts of digestive tract: Secondary | ICD-10-CM | POA: Diagnosis not present

## 2017-08-12 DIAGNOSIS — Z880 Allergy status to penicillin: Secondary | ICD-10-CM

## 2017-08-12 DIAGNOSIS — Z87442 Personal history of urinary calculi: Secondary | ICD-10-CM | POA: Diagnosis not present

## 2017-08-12 DIAGNOSIS — F329 Major depressive disorder, single episode, unspecified: Secondary | ICD-10-CM

## 2017-08-12 DIAGNOSIS — E669 Obesity, unspecified: Secondary | ICD-10-CM | POA: Diagnosis not present

## 2017-08-12 DIAGNOSIS — Z79899 Other long term (current) drug therapy: Secondary | ICD-10-CM | POA: Diagnosis not present

## 2017-08-12 DIAGNOSIS — K5900 Constipation, unspecified: Secondary | ICD-10-CM | POA: Diagnosis not present

## 2017-08-12 DIAGNOSIS — N189 Chronic kidney disease, unspecified: Secondary | ICD-10-CM

## 2017-08-12 DIAGNOSIS — Z803 Family history of malignant neoplasm of breast: Secondary | ICD-10-CM | POA: Diagnosis not present

## 2017-08-12 LAB — FERRITIN: FERRITIN: 11 ng/mL (ref 11–307)

## 2017-08-12 LAB — CBC WITH DIFFERENTIAL/PLATELET
Basophils Absolute: 0.1 10*3/uL (ref 0–0.1)
Basophils Relative: 1 %
EOS ABS: 0.1 10*3/uL (ref 0–0.7)
EOS PCT: 1 %
HCT: 37.4 % (ref 35.0–47.0)
HEMOGLOBIN: 12.6 g/dL (ref 12.0–16.0)
LYMPHS ABS: 2.1 10*3/uL (ref 1.0–3.6)
LYMPHS PCT: 18 %
MCH: 27.4 pg (ref 26.0–34.0)
MCHC: 33.7 g/dL (ref 32.0–36.0)
MCV: 81.4 fL (ref 80.0–100.0)
MONOS PCT: 8 %
Monocytes Absolute: 0.9 10*3/uL (ref 0.2–0.9)
Neutro Abs: 8.3 10*3/uL — ABNORMAL HIGH (ref 1.4–6.5)
Neutrophils Relative %: 72 %
Platelets: 310 10*3/uL (ref 150–440)
RBC: 4.6 MIL/uL (ref 3.80–5.20)
RDW: 15.7 % — ABNORMAL HIGH (ref 11.5–14.5)
WBC: 11.5 10*3/uL — AB (ref 3.6–11.0)

## 2017-08-12 LAB — IRON AND TIBC
Iron: 89 ug/dL (ref 28–170)
Saturation Ratios: 19 % (ref 10.4–31.8)
TIBC: 479 ug/dL — ABNORMAL HIGH (ref 250–450)
UIBC: 390 ug/dL

## 2017-08-12 NOTE — Progress Notes (Signed)
Patient states that she feels tired all the time and sleeps a lot.

## 2017-08-12 NOTE — Progress Notes (Signed)
Hematology/Oncology Consult note [facility]  Telephone:([phone] [phone]  Patient Care Team: Lovett Sox as PCP - General (Physician Assistant)   Name of the patient: Jessica Mckay  [id number]  [birth year]   Date of visit: 08/13/17  Diagnosis- iron deficiency anemia  Chief complaint/ Reason for visit- routine f/u  Heme/Onc history: patient is a 31 year old female with a past medical history significant for iron deficiency anemia for which she has been taking oral iron. Her other past medical history significant for hypertension, depression.   1. 03/19/17- Patient referred to clinic for IDA, on oral iron for 3 months. Hemoglobin had been 9-10 at that time with evidence of chronic microcytosis. Iron studies had shown serum iron of 7, ferritin of 3 and an elevated TIBC of 523. Folate had been normal of 22.3. She has a history of heavy menstural periods with passage of clots. No family history of bleeding disorder. Personal history of cholecystectomy, tubal ligation, and 3 cesarean sections without complications. Some mild bleeding with hemorrhoids.   2. 03/19/17- Dose 1 of feraheme initiated but patient developed nausea and abdominal pain during infusion. Infusion was stopped. Patient had elevated BP and HR. Benadryl, steroids, and zofran improved her symptoms. She did not receive 2nd dose.  3. 03/2017- hemoglobin normal at 12.4 but continues to have microcytic and hypochromic red cells. Patient underwent colonoscopy and upper endoscopy which were unremarkable with no evidence of ulceration or bleeding.   4. 05/07/17- Coagulation studies were normal, von willebrand negative, factor viii normal, and celiac disease panel normal. Reticulocytes were normal, haptoglobin was high end normal, tsh 3.426, b12 normal. Ferritin at that time was 7, tibc elevated at 474, and saturation ratio decreased at 7. Mammogram ordered for right breast pain with nipple  discharge. In 2015, patient had Bi-RADS category 3 mammogram. 05/13/17 mammogram was Bi-RADS category 1 with recommendation of screening starting at age 34.   Interval history- states that her fatigue has not improved significantly. She is taking oral iron but is unsure of dosing. She has occasional constipation with bowel movements every 2-3 days. Denies abdominal pain. She reports significant stress at home, is a single mother with 3 children. She is concerned about her weight gain. Denies bleeding, melena, nausea, vomiting. Continues to have heavy periods.    ECOG PS- 1 Pain scale- 0  Review of systems- Review of Systems  Constitutional: Negative for chills, fever, malaise/fatigue and weight loss.       Weight gain 13 lbs  HENT: Negative for congestion, ear discharge and nosebleeds.   Eyes: Negative for blurred vision.  Respiratory: Negative for cough, hemoptysis, sputum production, shortness of breath and wheezing.   Cardiovascular: Negative for chest pain, palpitations, orthopnea and claudication.  Gastrointestinal: Negative for abdominal pain, blood in stool, constipation, diarrhea, heartburn, melena, nausea and vomiting.  Genitourinary: Negative for dysuria, flank pain, frequency, hematuria and urgency.  Musculoskeletal: Negative for back pain, joint pain and myalgias.  Skin: Negative for rash.  Neurological: Negative for dizziness, tingling, focal weakness, seizures, weakness and headaches.  Endo/Heme/Allergies: Does not bruise/bleed easily.  Psychiatric/Behavioral: Negative for depression and suicidal ideas. The patient is nervous/anxious. The patient does not have insomnia.     Allergies  Allergen Reactions  . Amoxicillin Hives, Itching and Swelling  . Morphine And Related Hives and Shortness Of Breath    Chest pains  . Penicillins Itching and Swelling    Swelling in throat  . Erythromycin Hives  . Gabapentin  Other (See Comments)    Seizures   . Hydrocodone Other (See  Comments)    Elevates heart rate  . Latex Hives  . Oxycodone Other (See Comments)    Elevates heart rate  . Valium [Diazepam]     HIVES AND SHORTNESS OF BREATH    Past Medical History:  Diagnosis Date  . Anemia   . Anxiety   . Arthritis    hands and feet  . Chronic kidney disease    kidney stones and pyleo  . Depression   . Dysmenorrhea 09/05/2015  . 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 Hypertension    during pregnancy  . Menorrhagia with regular cycle 09/05/2015  . Mental disorder    depression  . Migraines   . Pelvic pain in female 02/12/2016    Past Surgical History:  Procedure Laterality Date  . CESAREAN SECTION  2002,2006,2010  . CHOLECYSTECTOMY  2007  . COLONOSCOPY WITH PROPOFOL N/A 04/16/2017   Procedure: COLONOSCOPY WITH PROPOFOL;  Surgeon: [name], MD;  Location: [place];  Service: Endoscopy;  Laterality: N/A;  . ESOPHAGOGASTRODUODENOSCOPY (EGD) WITH PROPOFOL N/A 04/16/2017   Procedure: ESOPHAGOGASTRODUODENOSCOPY (EGD) WITH PROPOFOL;  Surgeon: [name], MD;  Location: [place];  Service: Endoscopy;  Laterality: N/A;  Latex Allergy  . POLYPECTOMY N/A 04/16/2017   Procedure: POLYPECTOMY;  Surgeon: [name], MD;  Location: [place];  Service: Endoscopy;  Laterality: N/A;  . RENAL ARTERY STENT  2016   removed 2016 -one month after inserted  . STENT PLACEMENT RT URETER ([place] HX)    . TUBAL LIGATION      Social History   Social History  . Marital status: Single    Spouse name: N/A  . Number of children: N/A  . Years of education: N/A   Occupational History  . Not on file.   Social History Main Topics  . Smoking status: Never Smoker  . Smokeless tobacco: Never Used  . Alcohol use No  . Drug use: No  . Sexual activity: Yes    Partners: Male    Birth control/ protection: Surgical     Comment: tubal   Other Topics Concern  . Not on file   Social History Narrative  . No narrative on file    Family History    Problem Relation Age of Onset  . Asthma Daughter   . ADD / ADHD Son   . Bipolar disorder Son   . Other Son        sleeping disorder  . Autism Son   . Other Son        hole in heart; sleeping disorder  . ADD / ADHD Son   . Diabetes Father   . Hypertension Father   . Arthritis Father   . Breast cancer Maternal Aunt        breast  . Breast cancer Cousin        breast    Current Outpatient Prescriptions:  .  busPIRone (BUSPAR) 5 MG tablet, Take 5 mg by mouth 2 (two) times daily at 10 AM and 5 PM., Disp: , Rfl:  .  DULoxetine (CYMBALTA) 20 MG capsule, Take 20 mg by mouth at bedtime. , Disp: , Rfl:  .  fluticasone (FLONASE) 50 MCG/ACT nasal spray, Place 2 sprays into both nostrils daily. , Disp: , Rfl:  .  IRON PO, Take 150 mg by mouth 2 (two) times daily. , Disp: , Rfl:  .  lisinopril (PRINIVIL,ZESTRIL) 5 MG tablet, Take by  mouth daily. , Disp: , Rfl:  .  nortriptyline (PAMELOR) 10 MG capsule, Take 20 mg by mouth at bedtime., Disp: , Rfl:  .  venlafaxine XR (EFFEXOR-XR) 75 MG 24 hr capsule, Take by mouth daily with breakfast. , Disp: , Rfl:   Physical exam:  Vitals:   08/12/17 1137 08/12/17 1141  BP: 122/84 132/84  Pulse: (!) 102 (!) 102  Temp: 98.7 F (37.1 C) 98.7 F (37.1 C)  TempSrc: Tympanic Tympanic  Weight: 233 lb 14.4 oz (106.1 kg) 233 lb 14.4 oz (106.1 kg)  Height: 5' (1.524 m)    Physical Exam  Constitutional:  Obese, young woman in no distress  HENT:  Head: Normocephalic and atraumatic.  Eyes: Pupils are equal, round, and reactive to light. EOM are normal.  Neck: Normal range of motion. No JVD present. No thyromegaly present.  Cardiovascular: Regular rhythm, normal heart sounds and normal pulses.  Tachycardia present.   Pulmonary/Chest: Effort normal. She has no wheezes.  Abdominal: Soft. Bowel sounds are normal. She exhibits no distension. There is no tenderness.  Lymphadenopathy:    She has no cervical adenopathy.  Neurological: She is alert.  Skin: Skin  is warm and intact. No pallor.  sweating     CMP Latest Ref Rng & Units 04/08/2016  Glucose 65 - 99 mg/dL 89  BUN 6 - 20 mg/dL 12  Creatinine 0.57 - 1.00 mg/dL 0.86  Sodium 134 - 144 mmol/L 139  Potassium 3.5 - 5.2 mmol/L 4.3  Chloride 96 - 106 mmol/L 102  CO2 18 - 29 mmol/L 21  Calcium 8.7 - 10.2 mg/dL 9.1  Total Protein 6.0 - 8.5 g/dL 7.3  Total Bilirubin 0.0 - 1.2 mg/dL <0.2  Alkaline Phos 39 - 117 IU/L 84  AST 0 - 40 IU/L 13  ALT 0 - 32 IU/L 10   CBC Latest Ref Rng & Units 08/12/2017  WBC 3.6 - 11.0 K/uL 11.5(H)  Hemoglobin 12.0 - 16.0 g/dL 12.6  Hematocrit 35.0 - 47.0 % 37.4  Platelets 150 - 440 K/uL 310    Assessment and plan- Patient is a 31 y.o. female referred for iron deficiency anemia  1. IDA- Iron studies today show low and normal ferritin level of 11.  TIBC is slightly elevated at 479. Serum iron level of 89 indicative of compliance with oral iron. Patient has been unable to tolerate Feraheme in the past. Patient has not brought in stool sample for H. Pylori antigen testing. Will continue oral iron at this time. Could consider Venofer in the future if IV iron  supplementation is required. Will reach out to patient to confirm OTC iron dosage.  2. Right breast pain- resolved. Mammogram from June 2018 was bi-rads category 1. Recommend screening mammograms at age 48. Patient to continue self-breast exams.   3. Obesity- discussed with patient need for exercise and weight reduction. BMI today 45.5. Patient has gained 13 pounds since June visit. Patient states she has attempted dieting in the past without success. Patient has talked to PCP about these concerns as well. . Will continue to monitor. [facility] consider [facility] to [facility] [place]. Appreciate PCPs management of this concern.   We'll plan for her to return to clinic in 3 months for lab work, and again in 6 months for lab work and further evaluation of iron deficiency  anemia.  Visit Diagnosis 1. Iron deficiency anemia, unspecified iron deficiency anemia type      Dr. Randa Evens, MD, MPH  [place] at [facility] Pager- [phone] 08/13/2017  8:54 AM

## 2017-08-17 ENCOUNTER — Telehealth: Payer: Self-pay | Admitting: *Deleted

## 2017-08-17 NOTE — Telephone Encounter (Signed)
-----   Message from Sindy Guadeloupe, MD sent at 08/12/2017  1:47 PM EDT ----- Ferritin remains low. TIBC elevated. Lets give her 5 does of venofer over 2 weeks if she is willing. She had problems with feraheme. Thanks.

## 2017-08-17 NOTE — Telephone Encounter (Signed)
Pt is agreeable to the venofer infusion over next 2 weeks since she did not do well with feraheme. I have sent message to scheduler. Pt aware that scheduler will call her

## 2017-08-18 ENCOUNTER — Telehealth: Payer: Self-pay | Admitting: Oncology

## 2017-08-18 NOTE — Telephone Encounter (Signed)
VENOFER x 5, to be done over 2 weeks, per 08/18/17 schd msg/Sherry.  Schd and conf with patient.

## 2017-08-23 ENCOUNTER — Inpatient Hospital Stay: Payer: Medicaid Other | Attending: Oncology

## 2017-08-23 DIAGNOSIS — N920 Excessive and frequent menstruation with regular cycle: Secondary | ICD-10-CM | POA: Insufficient documentation

## 2017-08-23 DIAGNOSIS — D509 Iron deficiency anemia, unspecified: Secondary | ICD-10-CM | POA: Insufficient documentation

## 2017-08-25 ENCOUNTER — Encounter: Payer: Self-pay | Admitting: *Deleted

## 2017-08-25 ENCOUNTER — Inpatient Hospital Stay: Payer: Medicaid Other

## 2017-08-27 ENCOUNTER — Inpatient Hospital Stay: Payer: Medicaid Other

## 2017-08-31 ENCOUNTER — Ambulatory Visit: Payer: Medicaid Other

## 2017-08-31 ENCOUNTER — Inpatient Hospital Stay: Payer: Medicaid Other

## 2017-08-31 DIAGNOSIS — D509 Iron deficiency anemia, unspecified: Secondary | ICD-10-CM

## 2017-08-31 DIAGNOSIS — N920 Excessive and frequent menstruation with regular cycle: Secondary | ICD-10-CM | POA: Diagnosis not present

## 2017-08-31 MED ORDER — ACETAMINOPHEN 325 MG PO TABS
650.0000 mg | ORAL_TABLET | Freq: Every day | ORAL | Status: DC
  Administered 2017-08-31: 650 mg via ORAL
  Filled 2017-08-31: qty 2

## 2017-08-31 MED ORDER — SODIUM CHLORIDE 0.9 % IV SOLN
INTRAVENOUS | Status: DC
  Administered 2017-08-31: 12:00:00 via INTRAVENOUS
  Filled 2017-08-31: qty 1000

## 2017-08-31 MED ORDER — DIPHENHYDRAMINE HCL 25 MG PO CAPS
25.0000 mg | ORAL_CAPSULE | Freq: Every day | ORAL | Status: DC
  Administered 2017-08-31: 25 mg via ORAL
  Filled 2017-08-31: qty 1

## 2017-08-31 MED ORDER — IRON SUCROSE 20 MG/ML IV SOLN
200.0000 mg | INTRAVENOUS | Status: DC
  Administered 2017-08-31: 200 mg via INTRAVENOUS
  Filled 2017-08-31: qty 10

## 2017-09-02 ENCOUNTER — Inpatient Hospital Stay: Payer: Medicaid Other

## 2017-09-02 ENCOUNTER — Ambulatory Visit: Payer: Medicaid Other

## 2017-09-02 VITALS — BP 135/85 | HR 76 | Temp 96.3°F | Resp 18

## 2017-09-02 DIAGNOSIS — D509 Iron deficiency anemia, unspecified: Secondary | ICD-10-CM

## 2017-09-02 MED ORDER — ACETAMINOPHEN 325 MG PO TABS
650.0000 mg | ORAL_TABLET | Freq: Every day | ORAL | Status: DC
  Administered 2017-09-02: 650 mg via ORAL
  Filled 2017-09-02: qty 2

## 2017-09-02 MED ORDER — IRON SUCROSE 20 MG/ML IV SOLN
200.0000 mg | INTRAVENOUS | Status: DC
  Administered 2017-09-02: 200 mg via INTRAVENOUS
  Filled 2017-09-02: qty 10

## 2017-09-02 MED ORDER — DIPHENHYDRAMINE HCL 25 MG PO CAPS
25.0000 mg | ORAL_CAPSULE | Freq: Every day | ORAL | Status: DC
  Administered 2017-09-02: 25 mg via ORAL

## 2017-09-02 MED ORDER — SODIUM CHLORIDE 0.9 % IV SOLN
Freq: Once | INTRAVENOUS | Status: AC
  Administered 2017-09-02: 11:00:00 via INTRAVENOUS
  Filled 2017-09-02: qty 1000

## 2017-09-06 ENCOUNTER — Inpatient Hospital Stay: Payer: Medicaid Other

## 2017-09-06 VITALS — BP 113/79 | HR 75 | Temp 98.1°F | Resp 20

## 2017-09-06 DIAGNOSIS — D509 Iron deficiency anemia, unspecified: Secondary | ICD-10-CM

## 2017-09-06 MED ORDER — ACETAMINOPHEN 325 MG PO TABS
650.0000 mg | ORAL_TABLET | Freq: Every day | ORAL | Status: DC
  Administered 2017-09-06: 650 mg via ORAL

## 2017-09-06 MED ORDER — DIPHENHYDRAMINE HCL 25 MG PO CAPS
ORAL_CAPSULE | ORAL | Status: AC
  Filled 2017-09-06: qty 1

## 2017-09-06 MED ORDER — ACETAMINOPHEN 325 MG PO TABS
ORAL_TABLET | ORAL | Status: AC
  Filled 2017-09-06: qty 2

## 2017-09-06 MED ORDER — IRON SUCROSE 20 MG/ML IV SOLN
200.0000 mg | INTRAVENOUS | Status: DC
  Administered 2017-09-06: 200 mg via INTRAVENOUS
  Filled 2017-09-06: qty 10

## 2017-09-06 MED ORDER — DIPHENHYDRAMINE HCL 25 MG PO CAPS
25.0000 mg | ORAL_CAPSULE | Freq: Every day | ORAL | Status: DC
  Administered 2017-09-06: 25 mg via ORAL

## 2017-09-08 ENCOUNTER — Inpatient Hospital Stay: Payer: Medicaid Other

## 2017-09-08 VITALS — BP 124/85 | HR 78 | Temp 96.5°F | Resp 20

## 2017-09-08 DIAGNOSIS — D509 Iron deficiency anemia, unspecified: Secondary | ICD-10-CM | POA: Diagnosis not present

## 2017-09-08 MED ORDER — IRON SUCROSE 20 MG/ML IV SOLN
200.0000 mg | INTRAVENOUS | Status: DC
  Administered 2017-09-08: 200 mg via INTRAVENOUS
  Filled 2017-09-08: qty 10

## 2017-09-08 MED ORDER — ACETAMINOPHEN 325 MG PO TABS
650.0000 mg | ORAL_TABLET | Freq: Every day | ORAL | Status: DC
  Administered 2017-09-08: 650 mg via ORAL
  Filled 2017-09-08: qty 2

## 2017-09-08 MED ORDER — DIPHENHYDRAMINE HCL 25 MG PO CAPS
25.0000 mg | ORAL_CAPSULE | Freq: Every day | ORAL | Status: DC
  Administered 2017-09-08: 25 mg via ORAL
  Filled 2017-09-08: qty 1

## 2017-09-08 MED ORDER — SODIUM CHLORIDE 0.9 % IV SOLN
Freq: Once | INTRAVENOUS | Status: AC
  Administered 2017-09-08: 11:00:00 via INTRAVENOUS
  Filled 2017-09-08: qty 1000

## 2017-09-13 ENCOUNTER — Inpatient Hospital Stay: Payer: Medicaid Other

## 2017-09-13 DIAGNOSIS — D509 Iron deficiency anemia, unspecified: Secondary | ICD-10-CM | POA: Diagnosis not present

## 2017-09-13 MED ORDER — ACETAMINOPHEN 325 MG PO TABS
650.0000 mg | ORAL_TABLET | Freq: Once | ORAL | Status: AC
  Administered 2017-09-13: 650 mg via ORAL

## 2017-09-13 MED ORDER — SODIUM CHLORIDE 0.9 % IV SOLN
Freq: Once | INTRAVENOUS | Status: AC
  Administered 2017-09-13: 11:00:00 via INTRAVENOUS
  Filled 2017-09-13: qty 1000

## 2017-09-13 MED ORDER — IRON SUCROSE 20 MG/ML IV SOLN
200.0000 mg | INTRAVENOUS | Status: DC
  Administered 2017-09-13: 200 mg via INTRAVENOUS
  Filled 2017-09-13: qty 10

## 2017-09-13 MED ORDER — ACETAMINOPHEN 325 MG PO TABS
ORAL_TABLET | ORAL | Status: AC
Start: 2017-09-13 — End: 2017-09-13
  Filled 2017-09-13: qty 2

## 2017-09-13 MED ORDER — DIPHENHYDRAMINE HCL 25 MG PO CAPS
25.0000 mg | ORAL_CAPSULE | Freq: Every day | ORAL | Status: DC
  Administered 2017-09-13: 25 mg via ORAL
  Filled 2017-09-13: qty 1

## 2017-11-05 ENCOUNTER — Ambulatory Visit: Payer: Medicaid Other | Admitting: Oncology

## 2017-11-05 ENCOUNTER — Other Ambulatory Visit: Payer: Medicaid Other

## 2017-11-11 ENCOUNTER — Inpatient Hospital Stay: Payer: Medicaid Other | Attending: Oncology

## 2017-11-18 ENCOUNTER — Ambulatory Visit: Payer: Self-pay | Admitting: Adult Health

## 2018-01-12 IMAGING — MR MR LUMBAR SPINE W/O CM
5 series · 39 of 48 positions shown · non-contrast
Comparison: None.

CLINICAL DATA: Low back pain with numbness in both legs. Right leg
pain. Symptoms of several years duration, worsening.

EXAM:
MRI LUMBAR SPINE WITHOUT CONTRAST
TECHNIQUE: Multiplanar, multisequence MR imaging of the lumbar spine was
performed. No intravenous contrast was administered.

[Series 2: T2 · sagittal · 4.0mm · 0.81mm/px · 6 of 15 slices shown (1 of 2)]
[im 1/15]
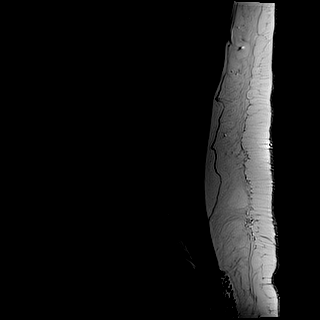
[im 3/15]
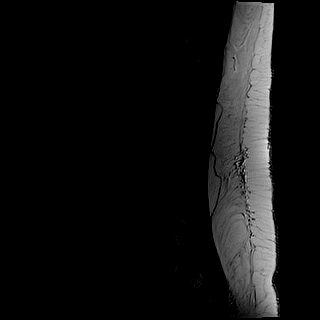
[im 6/15]
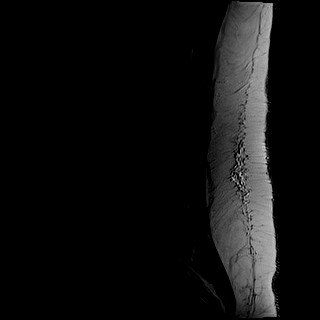
[im 9/15]
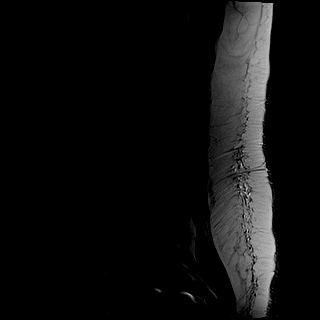
[im 12/15]
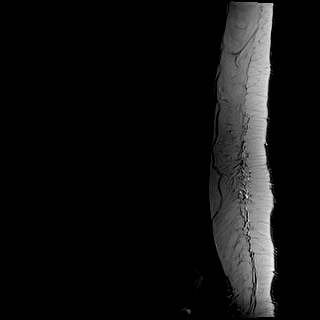
[im 15/15]
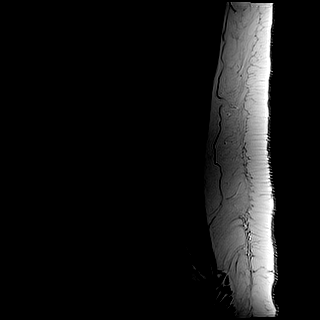

[Series 3: T1 · sagittal · 4.0mm · 0.81mm/px · 7 of 15 slices shown (1 of 2)]
[im 1/15]
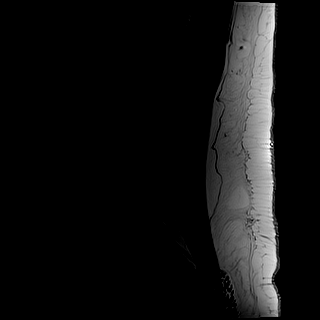
[im 3/15]
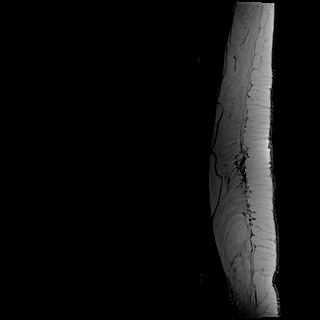
[im 5/15]
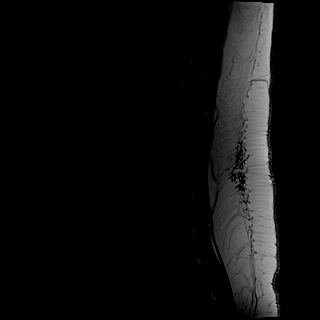
[im 8/15]
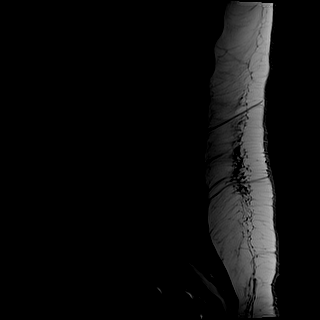
[im 10/15]
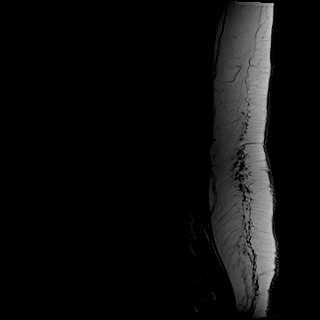
[im 12/15]
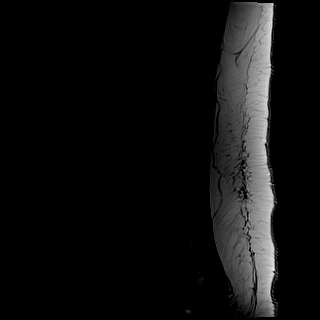
[im 15/15]
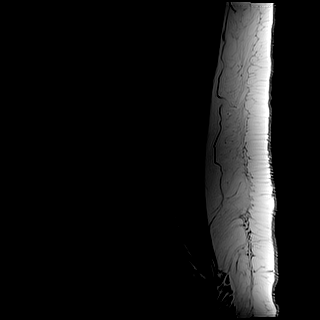

[Series 4: STIR · sagittal · 4.0mm · 1.02mm/px · 7 of 15 slices shown]
[im 1/15]
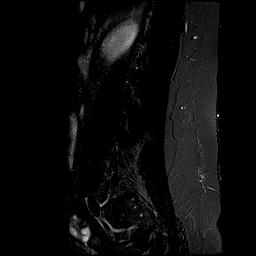
[im 3/15]
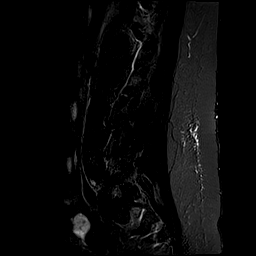
[im 5/15]
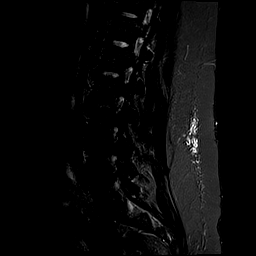
[im 8/15]
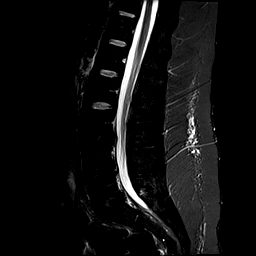
[im 10/15]
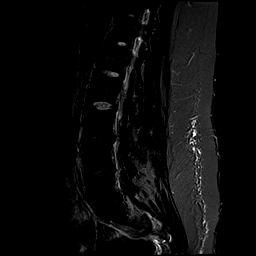
[im 12/15]
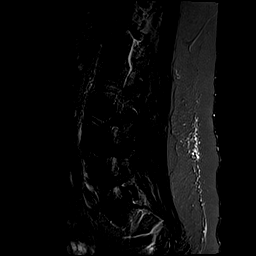
[im 15/15]
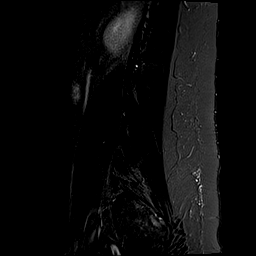

[Series 5: T2 · axial · 4.0mm · 0.78mm/px · z∈[-115,+60]mm · 11 of 30 slices shown (2 of 2)]
[im 1/30]
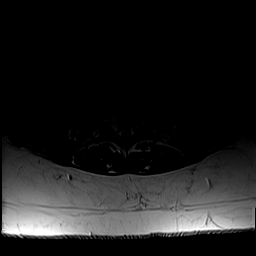
[im 3/30]
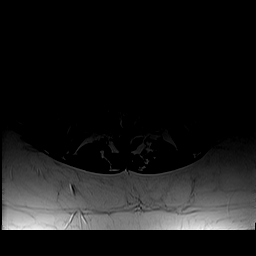
[im 5/30]
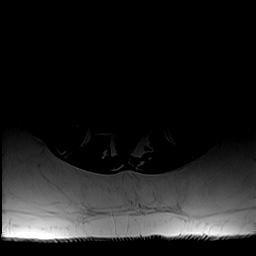
[im 7/30]
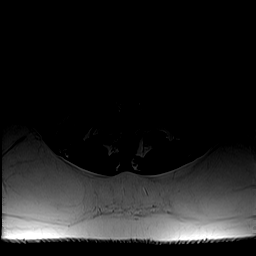
[im 9/30]
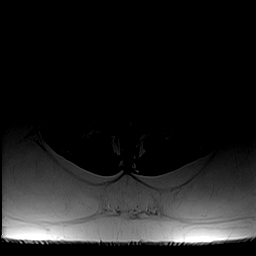
[im 12/30]
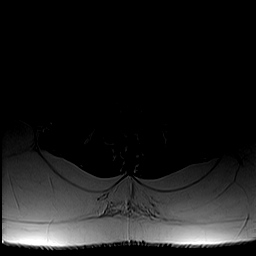
[im 14/30]
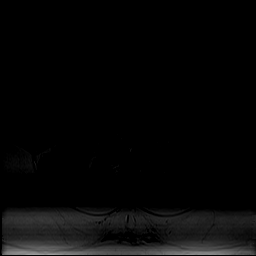
[im 16/30]
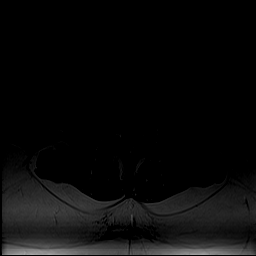
[im 21/30]
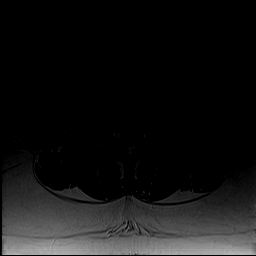
[im 25/30]
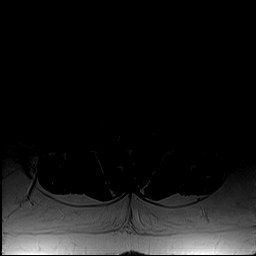
[im 30/30]
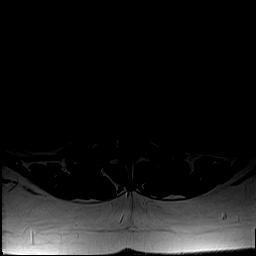

[Series 6: T1 · axial · 4.0mm · 0.39mm/px · z∈[-115,+60]mm · 8 of 30 slices shown (2 of 2)]
[im 1/30]
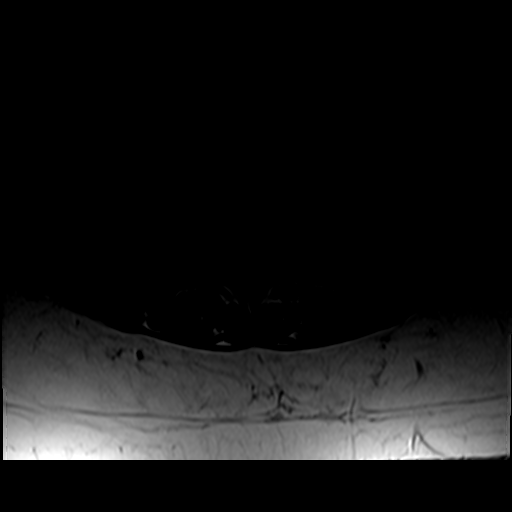
[im 5/30]
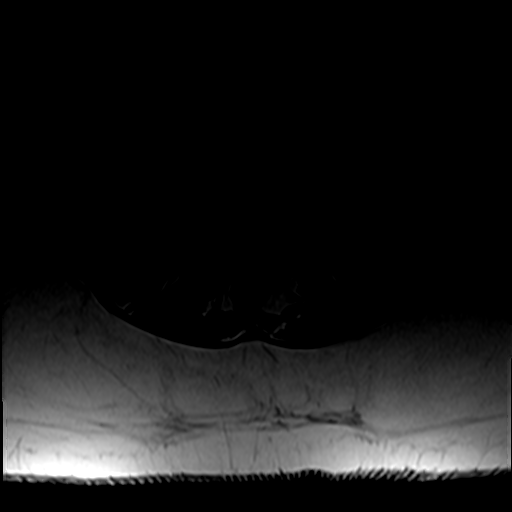
[im 9/30]
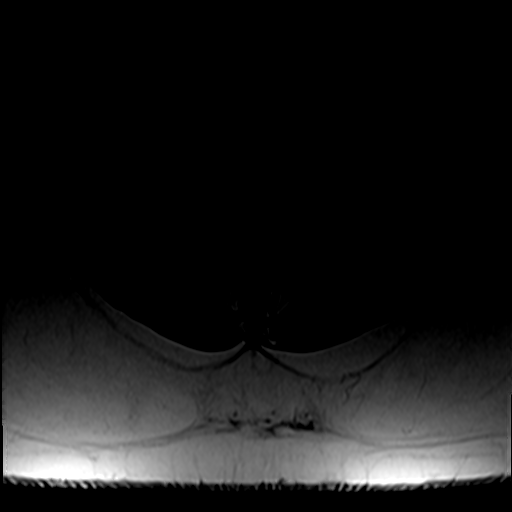
[im 14/30]
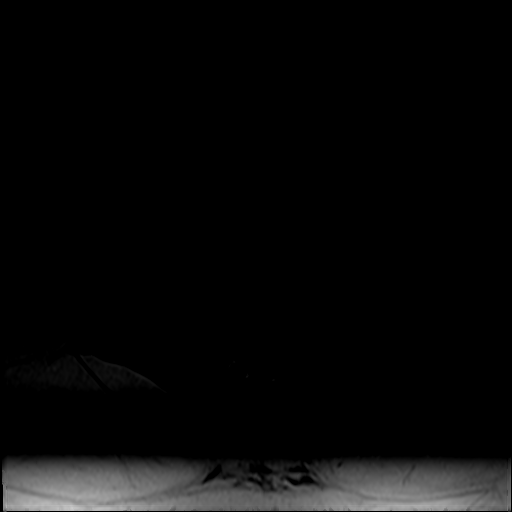
[im 16/30]
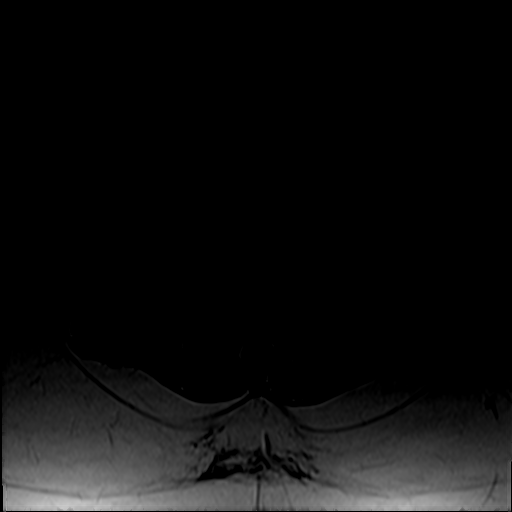
[im 21/30]
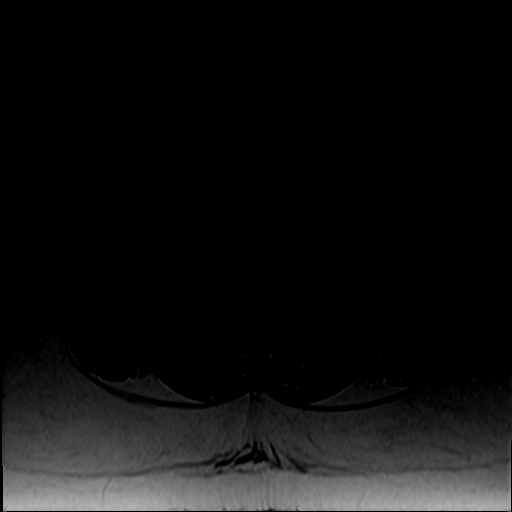
[im 25/30]
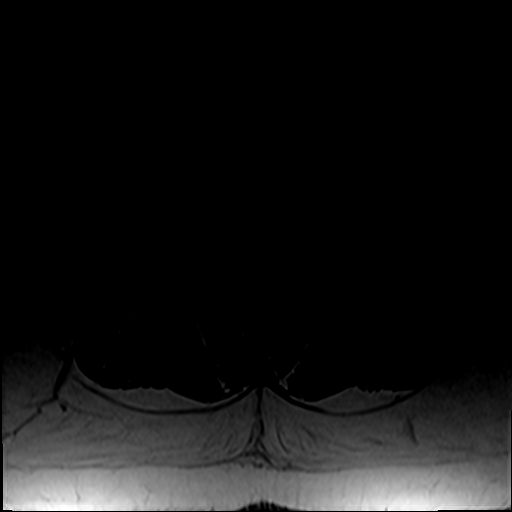
[im 30/30]
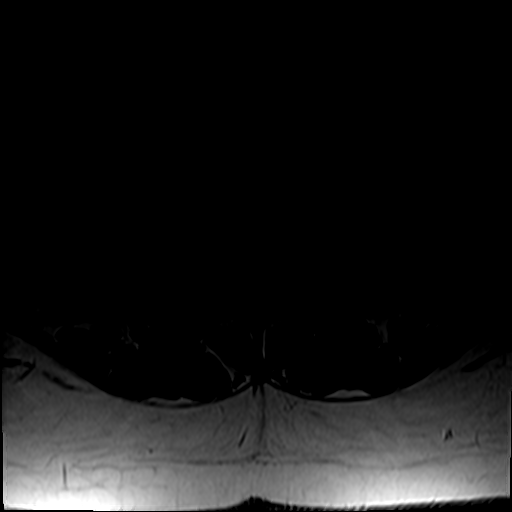

[39 of 48 positions shown; findings below may reference images not displayed]

FINDINGS: Segmentation:  5 lumbar type vertebral bodies assumed.

Alignment:  Normal

Vertebrae:  No fracture or primary bone lesion.

Conus medullaris: Extends to the L1 level and appears normal.

Paraspinal and other soft tissues: Normal

Disc levels:

No abnormality at L2-3 or above.

L3-4: Disc degeneration with annular fissures and annular bulging
that indents the thecal sac. Mild narrowing of the lateral recesses
without definite neural compression.

L4-5: Disc degeneration with annular fissures and annular bulging
that indents the thecal sac. Mild facet and ligamentous hypertrophy.
Mild narrowing of the lateral recesses without definite neural
compression.

L5-S1: Disc degeneration with annular fissures and annular bulging
that approaches the S1 nerves but does not compress or displace
them. Mild facet osteoarthritis.
IMPRESSION: Disc degeneration at L3-4, L4-5 and L5-S1 with annular fissures and
annular bulging but no herniated nuclear material. At L3-4 and L4-5,
there is indentation of the thecal sac but no clear neural
compression. At L5-S1 this approaches the S1 nerves but does not
compress or displace them. There is facet osteoarthritis at L4-5 and
L5-S1 that could contribute to low back pain.

## 2018-01-15 IMAGING — US US BREAST*R* LIMITED INC AXILLA
1 series · 6 of 6 positions shown · non-contrast
Comparison: Mammogram 07/28/2013

ACR Breast Density Category a: The breast tissue is almost entirely
fatty.

CLINICAL DATA: 31-year-old patient presents for evaluation due to
complaint of 1 month history of presumed right nipple discharge in
her clothing. She says the discharge is spontaneous. She has not
elicited any discharge manually. She believes that it is likely
white or clear in color. She denies any red or brown nipple
discharge on her clothing. She does not palpate a lump.

EXAM:
2D DIGITAL DIAGNOSTIC BILATERAL MAMMOGRAM WITH CAD AND ADJUNCT TOMO
ULTRASOUND RIGHT BREAST

[Series 1: us breast*right* limited inc axilla · 0.05mm/px · 6 of 6 slices shown]
[im 1/6]
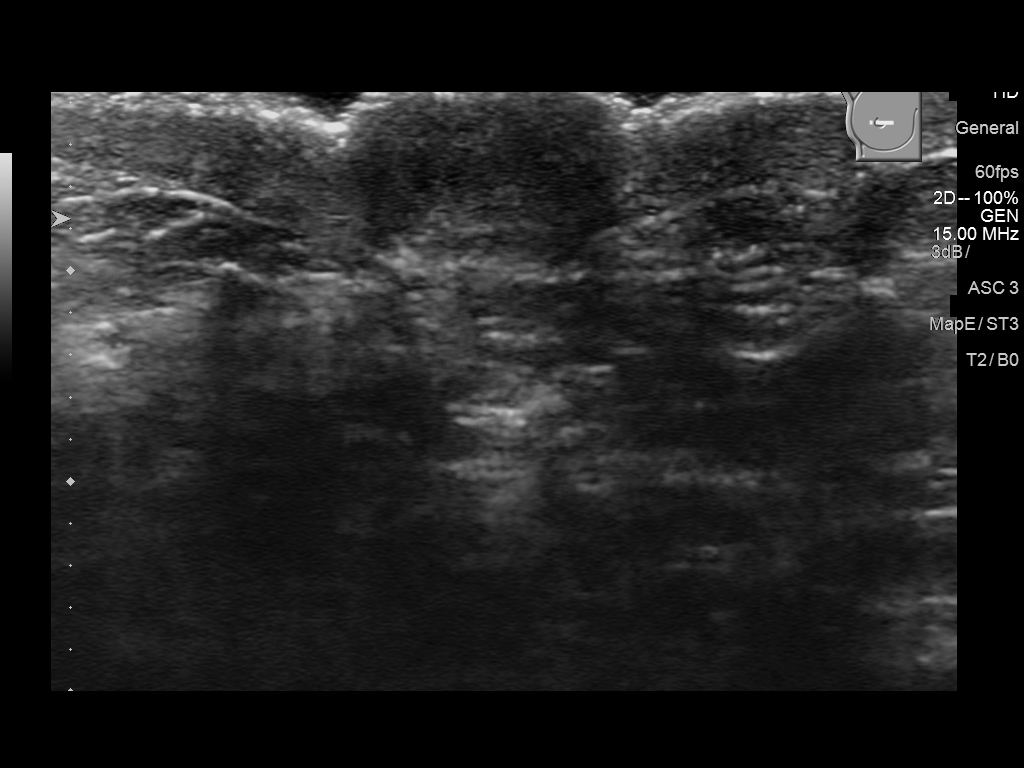
[im 2/6]
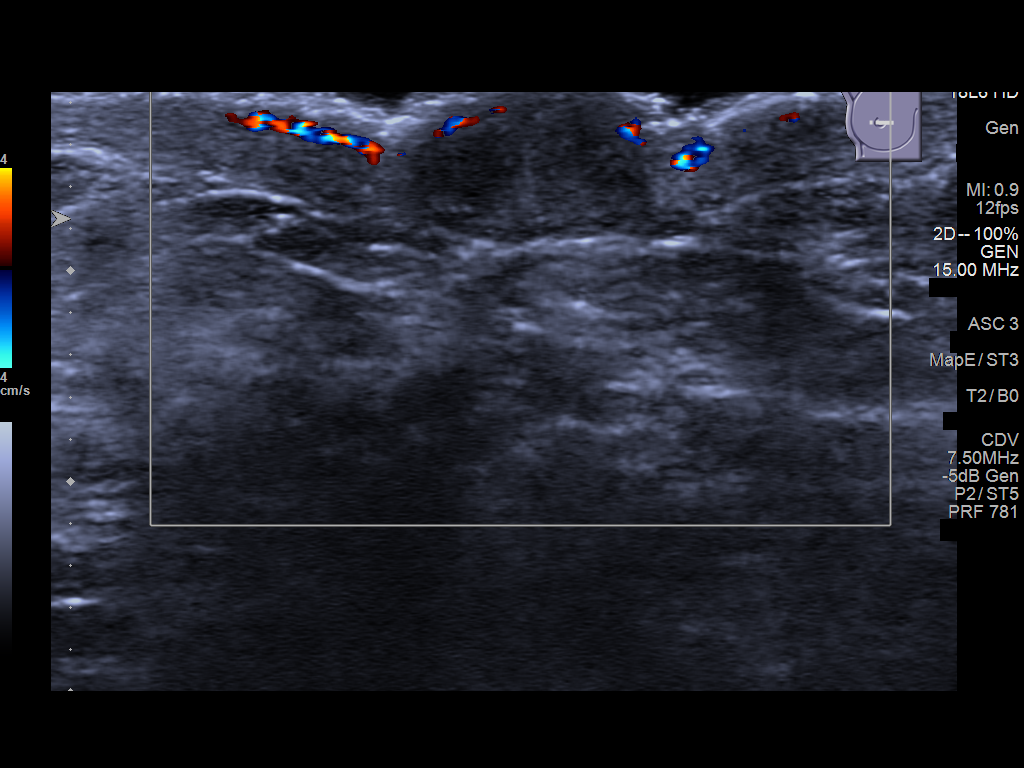
[im 3/6]
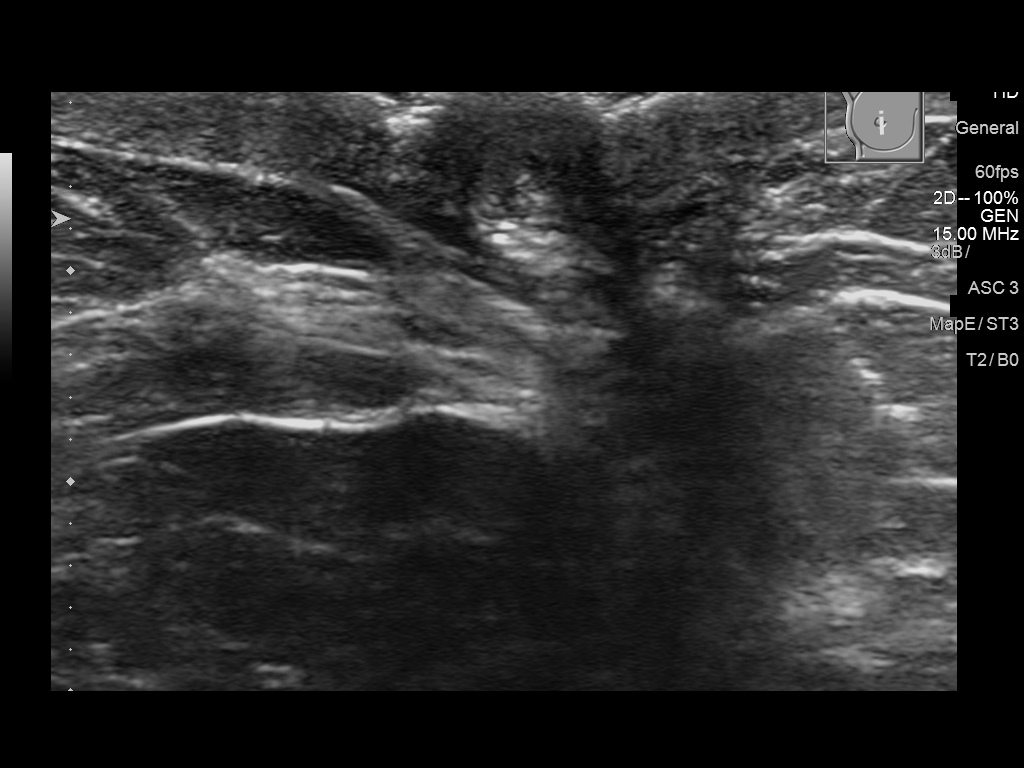
[im 4/6]
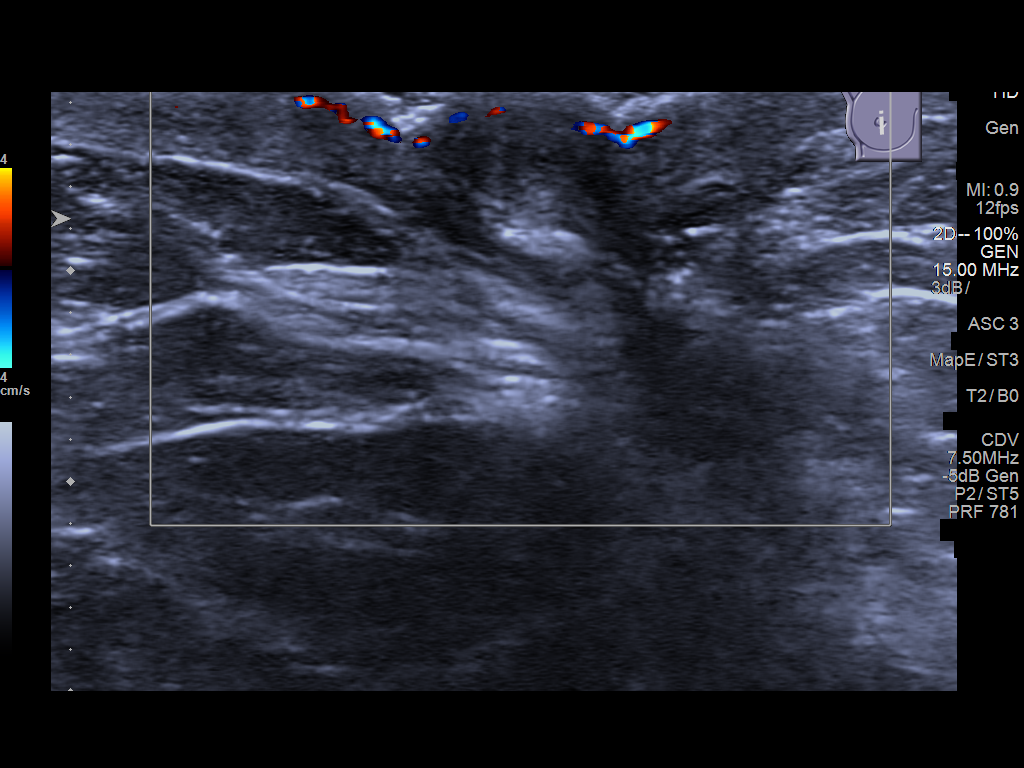
[im 5/6]
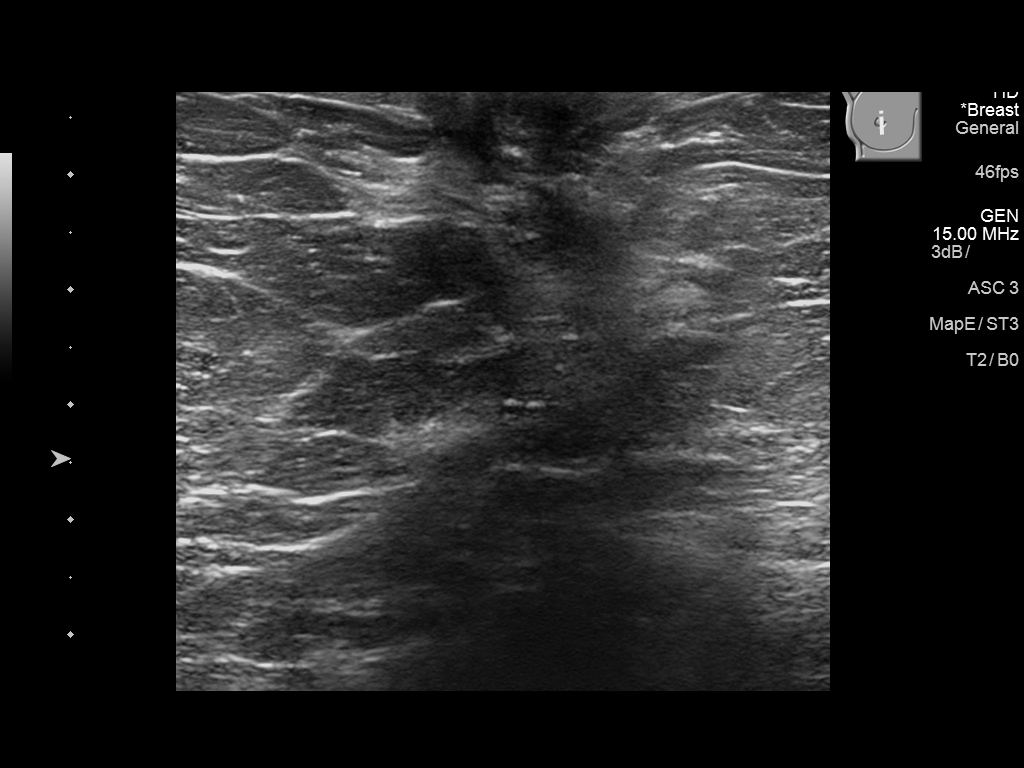
[im 6/6]
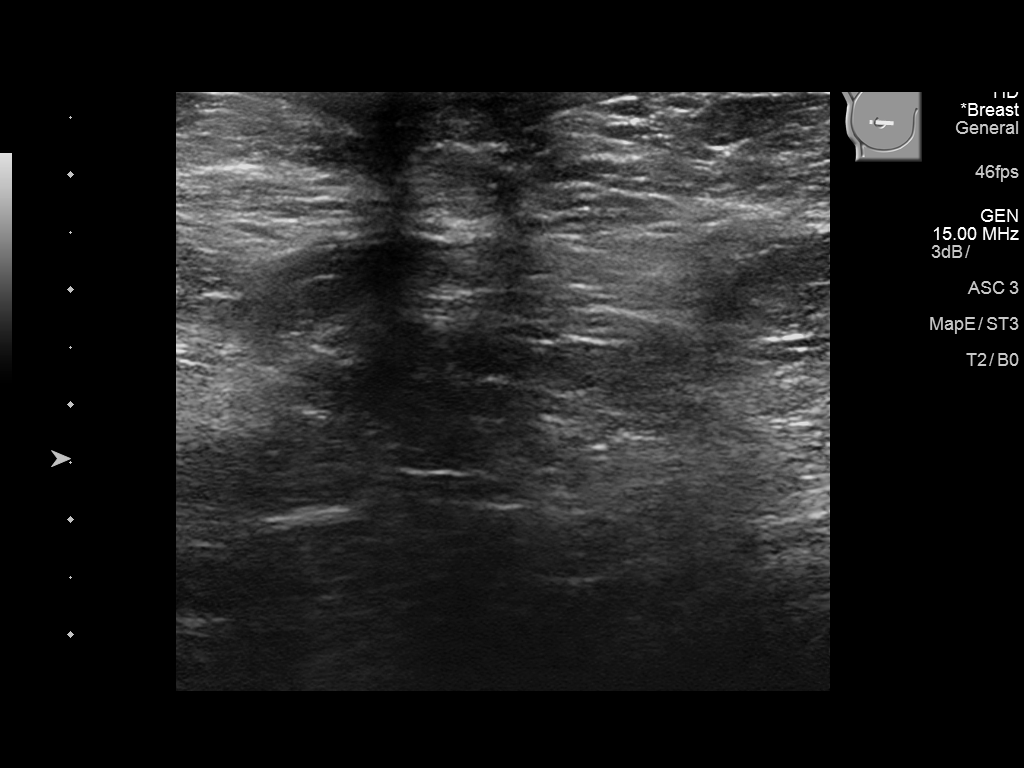

[6 of 6 positions shown; findings below may reference images not displayed]

FINDINGS: No mass, architectural distortion, or suspicious microcalcification
is identified in either breast to suggest malignancy.

Mammographic images were processed with CAD.

On physical exam, the skin of the right breast appears normal. I
attempted to elicit discharge from the right nipple today on
physical exam. No discharge is elicited manually. Additionally it is
noted that no discharge occurred during acquisition of the mammogram
images.

Targeted ultrasound is performed, showing normal retroareolar fatty
breast parenchyma. No dilated ducts, intraductal mass, or
parenchymal mass is identified.
IMPRESSION: No evidence of malignancy in either breast.

No right nipple discharge occurred today on physical exam or during
acquisition of mammogram images. Retroareolar right breast
ultrasound is negative.

RECOMMENDATION:
Screening mammogram at age 40 unless there are persistent or
intervening clinical concerns. (Code:[id number])

The patient is encouraged to return for further evaluation if she
should notice brown or bloody nipple discharge or palpate a mass in
either breast. If single duct discharge is ever confirmed on
clinical exam, then further evaluation with breast MRI could be
considered.

I have discussed the findings and recommendations with the patient.
Results were also provided in writing at the conclusion of the
visit. If applicable, a reminder letter will be sent to the patient
regarding the next appointment.

BI-RADS CATEGORY  1: Negative.

## 2018-02-10 ENCOUNTER — Inpatient Hospital Stay: Payer: Medicaid Other | Admitting: Oncology

## 2018-02-10 ENCOUNTER — Inpatient Hospital Stay: Payer: Medicaid Other

## 2018-02-14 ENCOUNTER — Other Ambulatory Visit: Payer: Self-pay

## 2018-02-14 DIAGNOSIS — D508 Other iron deficiency anemias: Secondary | ICD-10-CM

## 2018-02-15 ENCOUNTER — Telehealth: Payer: Self-pay | Admitting: Oncology

## 2018-02-15 ENCOUNTER — Inpatient Hospital Stay: Payer: Medicaid Other | Admitting: Oncology

## 2018-02-15 ENCOUNTER — Inpatient Hospital Stay: Payer: Medicaid Other

## 2018-02-15 NOTE — Telephone Encounter (Signed)
Patient has 4 sets of appts that she did not show.  Patient finished Venofer x 5 Tx, on 08/24/17 and haven't returned for follow up since. Patient was rschd multiple times. Last appts was rschd by Beverlee Nims.  Message sent to Care Team/Provider for advice.

## 2018-02-17 ENCOUNTER — Inpatient Hospital Stay: Payer: Medicaid Other | Attending: Oncology

## 2018-02-17 ENCOUNTER — Inpatient Hospital Stay (HOSPITAL_BASED_OUTPATIENT_CLINIC_OR_DEPARTMENT_OTHER): Payer: Medicaid Other | Admitting: Oncology

## 2018-02-17 ENCOUNTER — Encounter: Payer: Self-pay | Admitting: Oncology

## 2018-02-17 VITALS — BP 134/82 | HR 82 | Temp 98.1°F | Resp 18

## 2018-02-17 DIAGNOSIS — D509 Iron deficiency anemia, unspecified: Secondary | ICD-10-CM | POA: Insufficient documentation

## 2018-02-17 DIAGNOSIS — F419 Anxiety disorder, unspecified: Secondary | ICD-10-CM | POA: Diagnosis not present

## 2018-02-17 DIAGNOSIS — D508 Other iron deficiency anemias: Secondary | ICD-10-CM

## 2018-02-17 LAB — CBC WITH DIFFERENTIAL/PLATELET
BASOS ABS: 0.1 10*3/uL (ref 0–0.1)
Basophils Relative: 1 %
Eosinophils Absolute: 0.2 10*3/uL (ref 0–0.7)
Eosinophils Relative: 2 %
HEMATOCRIT: 40.1 % (ref 35.0–47.0)
Hemoglobin: 13.7 g/dL (ref 12.0–16.0)
LYMPHS PCT: 28 %
Lymphs Abs: 2.4 10*3/uL (ref 1.0–3.6)
MCH: 29.6 pg (ref 26.0–34.0)
MCHC: 34.1 g/dL (ref 32.0–36.0)
MCV: 86.9 fL (ref 80.0–100.0)
Monocytes Absolute: 0.5 10*3/uL (ref 0.2–0.9)
Monocytes Relative: 6 %
NEUTROS ABS: 5.5 10*3/uL (ref 1.4–6.5)
NEUTROS PCT: 63 %
Platelets: 305 10*3/uL (ref 150–440)
RBC: 4.62 MIL/uL (ref 3.80–5.20)
RDW: 13.9 % (ref 11.5–14.5)
WBC: 8.6 10*3/uL (ref 3.6–11.0)

## 2018-02-17 LAB — IRON AND TIBC
Iron: 54 ug/dL (ref 28–170)
Saturation Ratios: 14 % (ref 10.4–31.8)
TIBC: 395 ug/dL (ref 250–450)
UIBC: 341 ug/dL

## 2018-02-17 LAB — FERRITIN: FERRITIN: 26 ng/mL (ref 11–307)

## 2018-02-17 NOTE — Progress Notes (Signed)
No new changes ( arthritis pain noted / nothing new)

## 2018-02-21 ENCOUNTER — Telehealth: Payer: Self-pay | Admitting: *Deleted

## 2018-02-21 NOTE — Telephone Encounter (Signed)
Called pt and let her know that hgb is normal and ferritin is low at 26.  Md wanted to know if she would like to get venofer, wait and monitor labs or keep taking iron pills. She would like to go ahead and get venofer. She is free this week and next week. I will get her scheduled and have scheduler call her back when I check with insurance for approval.

## 2018-02-21 NOTE — Progress Notes (Signed)
Hematology/Oncology Consult note [facility]  Telephone:([phone] [phone]  Patient Care Team: Lovett Sox as PCP - General (Physician Assistant)   Name of the patient: Jessica Mckay  [id number]  [birth year]   Date of visit: 02/21/18  Diagnosis- iron deficiency anemia  Chief complaint/ Reason for visit- routine f/u of iron deficiency anemia  Heme/Onc history: patient is a 32 year old female with a past medical history significant for iron deficiency anemia for which she has been taking oral iron for about 3 months now. Her other past medical history significant for hypertension and depression. And a platelet count of 407. Since beginning of this year her hemoglobin has been between 9-10. She has evidence of chronic microcytosis. Iron studies from 03/04/2017 showed serum iron of 7, ferritin of 3 and an elevated TIBC of 523. Folate was normal at 22.3.  Patient reports having heavy periods for the last 1 year or so you're in her menstrual cycles lasts for about 4 days but she needs to change her son 3 pads almost every hour and she reports passing blood clots. No family history of any bleeding disorder. She has herself undergone cholecystectomy tubal ligation and 3 cesarean sections in the past without any significant bleeding problems. She does not report any frequent use of NSAIDs and says that she cannot take them on a regular basis because she was told that she has stomach ulcers. She was seen by GI and underwent EGD and colonoscopy which was unremarkable  Patient had problem tolerating feraheme and plan was to switch to venofer but patient did not come for those appointments. She did have EGD and colonoscopy by [name] which was unremarkable.     Interval history- Patient reports that her menstrual cycles are no longer heavy. She takes one iron tablet a day. She continues to have problems with anxiety. Complains of generalzied bodyacehs  and joint pain  ECOG PS- 0 Pain scale- 5   Review of systems- Review of Systems  Constitutional: Positive for malaise/fatigue.  Musculoskeletal: Positive for joint pain.  Psychiatric/Behavioral: The patient is nervous/anxious.     Allergies  Allergen Reactions  . Amoxicillin Hives, Itching and Swelling  . Morphine And Related Hives and Shortness Of Breath    Chest pains  . Penicillins Itching and Swelling    Swelling in throat  . Erythromycin Hives  . Gabapentin Other (See Comments)    Seizures   . Hydrocodone Other (See Comments)    Elevates heart rate  . Latex Hives  . Oxycodone Other (See Comments)    Elevates heart rate  . Valium [Diazepam]     HIVES AND SHORTNESS OF BREATH     Past Medical History:  Diagnosis Date  . Anemia   . Anxiety   . Arthritis    hands and feet  . Chronic kidney disease    kidney stones and pyleo  . Depression   . Dysmenorrhea 09/05/2015  . 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 Hypertension    during pregnancy  . Menorrhagia with regular cycle 09/05/2015  . Mental disorder    depression  . Migraines   . Pelvic pain in female 02/12/2016     Past Surgical History:  Procedure Laterality Date  . CESAREAN SECTION  2002,2006,2010  . CHOLECYSTECTOMY  2007  . COLONOSCOPY WITH PROPOFOL N/A 04/16/2017   Procedure: COLONOSCOPY WITH PROPOFOL;  Surgeon: [name], MD;  Location: [place];  Service: Endoscopy;  Laterality: N/A;  . ESOPHAGOGASTRODUODENOSCOPY (  EGD) WITH PROPOFOL N/A 04/16/2017   Procedure: ESOPHAGOGASTRODUODENOSCOPY (EGD) WITH PROPOFOL;  Surgeon: [name], MD;  Location: [place];  Service: Endoscopy;  Laterality: N/A;  Latex Allergy  . POLYPECTOMY N/A 04/16/2017   Procedure: POLYPECTOMY;  Surgeon: [name], MD;  Location: [place];  Service: Endoscopy;  Laterality: N/A;  . RENAL ARTERY STENT  2016   removed 2016 -one month after inserted  . STENT PLACEMENT RT URETER ([place] HX)    . TUBAL  LIGATION      Social History   Socioeconomic History  . Marital status: Single    Spouse name: Not on file  . Number of children: Not on file  . Years of education: Not on file  . Highest education level: Not on file  Occupational History  . Not on file  Social Needs  . Financial resource strain: Not on file  . Food insecurity:    Worry: Not on file    Inability: Not on file  . Transportation needs:    Medical: Not on file    Non-medical: Not on file  Tobacco Use  . Smoking status: Never Smoker  . Smokeless tobacco: Never Used  Substance and Sexual Activity  . Alcohol use: No  . Drug use: No  . Sexual activity: Yes    Partners: Male    Birth control/protection: Surgical    Comment: tubal  Lifestyle  . Physical activity:    Days per week: Not on file    Minutes per session: Not on file  . Stress: Not on file  Relationships  . Social connections:    Talks on phone: Not on file    Gets together: Not on file    Attends religious service: Not on file    Active member of club or organization: Not on file    Attends meetings of clubs or organizations: Not on file    Relationship status: Not on file  . Intimate partner violence:    Fear of current or ex partner: Not on file    Emotionally abused: Not on file    Physically abused: Not on file    Forced sexual activity: Not on file  Other Topics Concern  . Not on file  Social History Narrative  . Not on file    Family History  Problem Relation Age of Onset  . Asthma Daughter   . ADD / ADHD Son   . Bipolar disorder Son   . Other Son        sleeping disorder  . Autism Son   . Other Son        hole in heart; sleeping disorder  . ADD / ADHD Son   . Diabetes Father   . Hypertension Father   . Arthritis Father   . Breast cancer Maternal Aunt        breast  . Breast cancer Cousin        breast     Current Outpatient Medications:  .  busPIRone (BUSPAR) 5 MG tablet, Take 5 mg by mouth 2 (two) times daily at  10 AM and 5 PM., Disp: , Rfl:  .  IRON PO, Take 150 mg by mouth 2 (two) times daily. , Disp: , Rfl:  .  nortriptyline (PAMELOR) 10 MG capsule, Take 20 mg by mouth at bedtime., Disp: , Rfl:  .  DULoxetine (CYMBALTA) 20 MG capsule, Take 20 mg by mouth at bedtime. , Disp: , Rfl:  .  fluticasone (FLONASE)  50 MCG/ACT nasal spray, Place 2 sprays into both nostrils daily. , Disp: , Rfl:  .  lisinopril (PRINIVIL,ZESTRIL) 5 MG tablet, Take by mouth daily. , Disp: , Rfl:  .  venlafaxine XR (EFFEXOR-XR) 75 MG 24 hr capsule, Take by mouth daily with breakfast. , Disp: , Rfl:   Physical exam:  Vitals:   02/17/18 1531  BP: 134/82  Pulse: 82  Resp: 18  Temp: 98.1 F (36.7 C)  TempSrc: Tympanic  SpO2: 99%   Physical Exam  Constitutional: She is oriented to person, place, and time.  Patient is obese. Does not appear to be in any acute distress  HENT:  Head: Normocephalic and atraumatic.  Eyes: Pupils are equal, round, and reactive to light. EOM are normal.  Neck: Normal range of motion.  Cardiovascular: Normal rate, regular rhythm and normal heart sounds.  Pulmonary/Chest: Effort normal and breath sounds normal.  Abdominal: Soft. Bowel sounds are normal.  Neurological: She is alert and oriented to person, place, and time.  Skin: Skin is warm and dry.     CMP Latest Ref Rng & Units 04/08/2016  Glucose 65 - 99 mg/dL 89  BUN 6 - 20 mg/dL 12  Creatinine 0.57 - 1.00 mg/dL 0.86  Sodium 134 - 144 mmol/L 139  Potassium 3.5 - 5.2 mmol/L 4.3  Chloride 96 - 106 mmol/L 102  CO2 18 - 29 mmol/L 21  Calcium 8.7 - 10.2 mg/dL 9.1  Total Protein 6.0 - 8.5 g/dL 7.3  Total Bilirubin 0.0 - 1.2 mg/dL <0.2  Alkaline Phos 39 - 117 IU/L 84  AST 0 - 40 IU/L 13  ALT 0 - 32 IU/L 10   CBC Latest Ref Rng & Units 02/17/2018  WBC 3.6 - 11.0 K/uL 8.6  Hemoglobin 12.0 - 16.0 g/dL 13.7  Hematocrit 35.0 - 47.0 % 40.1  Platelets 150 - 440 K/uL 305     Assessment and plan- Patient is a 32 y.o. female with iron  deficiency anemia posisbly due to menorrhagia  Patient is not anemic today nd her hb has been between 12-13 over last 9 months. Her ferritin levels are low at 26. We will call the patient today and see if she would like to come for venofer. She has missed multiple appointments in the past. If patient does not wish to come for IV iron, she can continue oral irona nd get her cbc checked by her pcp. She can be sent back to [place] if her anemia worsens    Visit Diagnosis No diagnosis found.   Dr. Randa Evens, MD, MPH [place] at [facility] Pager- [phone] 02/21/2018 8:33 AM

## 2018-04-09 ENCOUNTER — Encounter: Payer: Self-pay | Admitting: Oncology

## 2018-04-11 ENCOUNTER — Other Ambulatory Visit: Payer: Self-pay | Admitting: Oncology

## 2018-04-11 ENCOUNTER — Encounter: Payer: Self-pay | Admitting: *Deleted

## 2018-04-14 ENCOUNTER — Inpatient Hospital Stay: Payer: Medicaid Other | Attending: Oncology

## 2018-04-14 VITALS — BP 144/87 | HR 76 | Temp 97.3°F | Resp 20

## 2018-04-14 DIAGNOSIS — D509 Iron deficiency anemia, unspecified: Secondary | ICD-10-CM | POA: Insufficient documentation

## 2018-04-14 MED ORDER — IRON SUCROSE 20 MG/ML IV SOLN
200.0000 mg | Freq: Once | INTRAVENOUS | Status: AC
  Administered 2018-04-14: 200 mg via INTRAVENOUS
  Filled 2018-04-14: qty 10

## 2018-04-14 MED ORDER — SODIUM CHLORIDE 0.9 % IV SOLN
Freq: Once | INTRAVENOUS | Status: AC
  Administered 2018-04-14: 14:00:00 via INTRAVENOUS
  Filled 2018-04-14: qty 1000

## 2018-05-08 ENCOUNTER — Encounter: Payer: Self-pay | Admitting: Oncology

## 2018-05-11 ENCOUNTER — Other Ambulatory Visit: Payer: Self-pay | Admitting: *Deleted

## 2018-05-11 DIAGNOSIS — D509 Iron deficiency anemia, unspecified: Secondary | ICD-10-CM

## 2018-05-11 NOTE — Telephone Encounter (Signed)
Pt sent message back that any day and time is good.  I sent message to Shirlean Mylar and she set her up appt 6/24 and 9/23 and robin called pt with dates and times

## 2018-05-16 ENCOUNTER — Inpatient Hospital Stay: Payer: Medicaid Other | Attending: Oncology

## 2018-05-16 DIAGNOSIS — D509 Iron deficiency anemia, unspecified: Secondary | ICD-10-CM | POA: Diagnosis not present

## 2018-05-16 LAB — CBC
HEMATOCRIT: 42.1 % (ref 35.0–47.0)
HEMOGLOBIN: 13.9 g/dL (ref 12.0–16.0)
MCH: 28.8 pg (ref 26.0–34.0)
MCHC: 33.1 g/dL (ref 32.0–36.0)
MCV: 87.1 fL (ref 80.0–100.0)
Platelets: 278 10*3/uL (ref 150–440)
RBC: 4.83 MIL/uL (ref 3.80–5.20)
RDW: 14 % (ref 11.5–14.5)
WBC: 10.1 10*3/uL (ref 3.6–11.0)

## 2018-05-16 LAB — IRON AND TIBC
IRON: 40 ug/dL (ref 28–170)
SATURATION RATIOS: 12 % (ref 10.4–31.8)
TIBC: 337 ug/dL (ref 250–450)
UIBC: 297 ug/dL

## 2018-05-16 LAB — FERRITIN: Ferritin: 46 ng/mL (ref 11–307)

## 2018-08-15 ENCOUNTER — Inpatient Hospital Stay: Payer: Medicaid Other | Attending: Oncology | Admitting: Oncology

## 2018-08-15 ENCOUNTER — Inpatient Hospital Stay: Payer: Medicaid Other

## 2018-08-15 ENCOUNTER — Encounter: Payer: Self-pay | Admitting: Oncology

## 2018-08-15 VITALS — BP 127/83 | HR 80 | Temp 96.7°F | Resp 18 | Ht 60.0 in | Wt 235.7 lb

## 2018-08-15 DIAGNOSIS — I10 Essential (primary) hypertension: Secondary | ICD-10-CM | POA: Diagnosis not present

## 2018-08-15 DIAGNOSIS — D509 Iron deficiency anemia, unspecified: Secondary | ICD-10-CM | POA: Insufficient documentation

## 2018-08-15 LAB — CBC
HEMATOCRIT: 39.6 % (ref 35.0–47.0)
HEMOGLOBIN: 12.8 g/dL (ref 12.0–16.0)
MCH: 28.4 pg (ref 26.0–34.0)
MCHC: 32.2 g/dL (ref 32.0–36.0)
MCV: 88.2 fL (ref 80.0–100.0)
Platelets: 238 10*3/uL (ref 150–440)
RBC: 4.49 MIL/uL (ref 3.80–5.20)
RDW: 14.1 % (ref 11.5–14.5)
WBC: 9 10*3/uL (ref 3.6–11.0)

## 2018-08-15 LAB — IRON AND TIBC
IRON: 43 ug/dL (ref 28–170)
Saturation Ratios: 11 % (ref 10.4–31.8)
TIBC: 380 ug/dL (ref 250–450)
UIBC: 337 ug/dL

## 2018-08-15 LAB — FERRITIN: FERRITIN: 13 ng/mL (ref 11–307)

## 2018-08-15 NOTE — Progress Notes (Signed)
Pt tired more and drinking coffee to keep energy , trouble sleeping at night- on trazodone

## 2018-08-15 NOTE — Progress Notes (Signed)
Hematology/Oncology Consult note [facility]  [phone] [phone]  Patient Care Team: [facility] as PCP - General   Name of the patient: Jessica Mckay  [id number]  November 06, 1986   Date of visit: 08/15/18  Diagnosis- iron deficiency anemia likely due to menorrhagia  Chief complaint/ Reason for visit- routine f/u of iron deficiency anemia  Heme/Onc history:  patient is a 32 year old female with a past medical history significant for iron deficiency anemia for which she has been taking oral iron for about 3 months now. Her other past medical history significant for hypertension and depression. And a platelet count of 407. Since beginning of this year her hemoglobin has been between 9-10. She has evidence of chronic microcytosis. Iron studies from 03/04/2017 showed serum iron of 7, ferritin of 3 and an elevated TIBC of 523. Folate was normal at 22.3.  Patient reports having heavy periods for the last 1 year or so you're in her menstrual cycles lasts for about 4 days but she needs to change her son 3 pads almost every hour and she reports passing blood clots. No family history of any bleeding disorder. She has herself undergone cholecystectomy tubal ligation and 3 cesarean sections in the past without any significant bleeding problems. She does not report any frequent use of NSAIDs and says that she cannot take them on a regular basis because she was told that she has stomach ulcers.She was seen by GI and underwent EGD and colonoscopy which was unremarkable  Patient had problem tolerating feraheme and plan was to switch to venofer but patient did not come for those appointments. She did have EGD and colonoscopy by [name] which was unremarkable.    Interval history- reports fatigue and trouble sleeping. She is trying to loose weight.   ECOG PS- 0 Pain scale- 0 Opioid associated constipation- no  Review of systems- Review of  Systems  Constitutional: Positive for malaise/fatigue. Negative for chills, fever and weight loss.  HENT: Negative for congestion, ear discharge and nosebleeds.   Eyes: Negative for blurred vision.  Respiratory: Negative for cough, hemoptysis, sputum production, shortness of breath and wheezing.   Cardiovascular: Negative for chest pain, palpitations, orthopnea and claudication.  Gastrointestinal: Negative for abdominal pain, blood in stool, constipation, diarrhea, heartburn, melena, nausea and vomiting.  Genitourinary: Negative for dysuria, flank pain, frequency, hematuria and urgency.  Musculoskeletal: Negative for back pain, joint pain and myalgias.  Skin: Negative for rash.  Neurological: Negative for dizziness, tingling, focal weakness, seizures, weakness and headaches.  Endo/Heme/Allergies: Does not bruise/bleed easily.  Psychiatric/Behavioral: Negative for depression and suicidal ideas. The patient does not have insomnia.      Allergies  Allergen Reactions  . Amoxicillin Hives, Itching and Swelling  . Morphine And Related Hives and Shortness Of Breath    Chest pains  . Penicillins Itching and Swelling    Swelling in throat  . Erythromycin Hives  . Gabapentin Other (See Comments)    Seizures   . Hydrocodone Other (See Comments)    Elevates heart rate  . Latex Hives  . Oxycodone Other (See Comments)    Elevates heart rate  . Valium [Diazepam]     HIVES AND SHORTNESS OF BREATH     Past Medical History:  Diagnosis Date  . Anemia   . Anxiety   . Arthritis    hands and feet  . Chronic kidney disease    kidney stones and pyleo  . Depression   .  Dysmenorrhea 09/05/2015  . 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 Hypertension    during pregnancy  . Menorrhagia with regular cycle 09/05/2015  . Mental disorder    depression  . Migraines   . Pelvic pain in female 02/12/2016     Past Surgical History:  Procedure Laterality Date  . CESAREAN SECTION  2002,2006,2010  .  CHOLECYSTECTOMY  2007  . COLONOSCOPY WITH PROPOFOL N/A 04/16/2017   Procedure: COLONOSCOPY WITH PROPOFOL;  Surgeon: [name], MD;  Location: [place];  Service: Endoscopy;  Laterality: N/A;  . ESOPHAGOGASTRODUODENOSCOPY (EGD) WITH PROPOFOL N/A 04/16/2017   Procedure: ESOPHAGOGASTRODUODENOSCOPY (EGD) WITH PROPOFOL;  Surgeon: [name], MD;  Location: [place];  Service: Endoscopy;  Laterality: N/A;  Latex Allergy  . POLYPECTOMY N/A 04/16/2017   Procedure: POLYPECTOMY;  Surgeon: [name], MD;  Location: [place];  Service: Endoscopy;  Laterality: N/A;  . RENAL ARTERY STENT  2016   removed 2016 -one month after inserted  . STENT PLACEMENT RT URETER ([place] HX)    . TUBAL LIGATION      Social History   Socioeconomic History  . Marital status: Single    Spouse name: Not on file  . Number of children: Not on file  . Years of education: Not on file  . Highest education level: Not on file  Occupational History  . Not on file  Social Needs  . Financial resource strain: Not on file  . Food insecurity:    Worry: Not on file    Inability: Not on file  . Transportation needs:    Medical: Not on file    Non-medical: Not on file  Tobacco Use  . Smoking status: Never Smoker  . Smokeless tobacco: Never Used  Substance and Sexual Activity  . Alcohol use: No  . Drug use: No  . Sexual activity: Yes    Partners: Male    Birth control/protection: Surgical    Comment: tubal  Lifestyle  . Physical activity:    Days per week: Not on file    Minutes per session: Not on file  . Stress: Not on file  Relationships  . Social connections:    Talks on phone: Not on file    Gets together: Not on file    Attends religious service: Not on file    Active member of club or organization: Not on file    Attends meetings of clubs or organizations: Not on file    Relationship status: Not on file  . Intimate partner violence:    Fear of current or ex partner: Not  on file    Emotionally abused: Not on file    Physically abused: Not on file    Forced sexual activity: Not on file  Other Topics Concern  . Not on file  Social History Narrative  . Not on file    Family History  Problem Relation Age of Onset  . Asthma Daughter   . ADD / ADHD Son   . Bipolar disorder Son   . Other Son        sleeping disorder  . Autism Son   . Other Son        hole in heart; sleeping disorder  . ADD / ADHD Son   . Diabetes Father   . Hypertension Father   . Arthritis Father   . Breast cancer Maternal Aunt        breast  . Breast cancer Cousin  breast     Current Outpatient Medications:  .  citalopram (CELEXA) 10 MG tablet, Take 10 mg by mouth daily., Disp: , Rfl:  .  diclofenac sodium (VOLTAREN) 1 % GEL, Apply topically 4 (four) times daily as needed., Disp: , Rfl:  .  fluticasone (FLONASE) 50 MCG/ACT nasal spray, Place 2 sprays into both nostrils daily. , Disp: , Rfl:  .  IRON PO, Take 150 mg by mouth 2 (two) times daily. , Disp: , Rfl:  .  traZODone (DESYREL) 100 MG tablet, Take 100 mg by mouth at bedtime., Disp: , Rfl:  .  DULoxetine (CYMBALTA) 20 MG capsule, Take 20 mg by mouth at bedtime. , Disp: , Rfl:  .  lisinopril (PRINIVIL,ZESTRIL) 5 MG tablet, Take by mouth daily. , Disp: , Rfl:  .  venlafaxine XR (EFFEXOR-XR) 75 MG 24 hr capsule, Take by mouth daily with breakfast. , Disp: , Rfl:   Physical exam:  Vitals:   08/15/18 1015  BP: 127/83  Pulse: 80  Resp: 18  Temp: (!) 96.7 F (35.9 C)  TempSrc: Tympanic  Weight: 235 lb 10.8 oz (106.9 kg)  Height: 5' (1.524 m)   Physical Exam  Constitutional: She is oriented to person, place, and time.  She is obese. Does not appear to be in any acute distress  HENT:  Head: Normocephalic and atraumatic.  Eyes: Pupils are equal, round, and reactive to light. EOM are normal.  Neck: Normal range of motion.  Cardiovascular: Normal rate, regular rhythm and normal heart sounds.  Pulmonary/Chest:  Effort normal and breath sounds normal.  Abdominal: Soft. Bowel sounds are normal.  Neurological: She is alert and oriented to person, place, and time.  Skin: Skin is warm and dry.     CMP Latest Ref Rng & Units 04/08/2016  Glucose 65 - 99 mg/dL 89  BUN 6 - 20 mg/dL 12  Creatinine 0.57 - 1.00 mg/dL 0.86  Sodium 134 - 144 mmol/L 139  Potassium 3.5 - 5.2 mmol/L 4.3  Chloride 96 - 106 mmol/L 102  CO2 18 - 29 mmol/L 21  Calcium 8.7 - 10.2 mg/dL 9.1  Total Protein 6.0 - 8.5 g/dL 7.3  Total Bilirubin 0.0 - 1.2 mg/dL <0.2  Alkaline Phos 39 - 117 IU/L 84  AST 0 - 40 IU/L 13  ALT 0 - 32 IU/L 10   CBC Latest Ref Rng & Units 08/15/2018  WBC 3.6 - 11.0 K/uL 9.0  Hemoglobin 12.0 - 16.0 g/dL 12.8  Hematocrit 35.0 - 47.0 % 39.6  Platelets 150 - 440 K/uL 238    Assessment and plan- Patient is a 33 y.o. female with iron deficiency anemia  Iron studies from today are pending. She is not anemic and will likely not require IV iron at this time. Repeat cbc with diff in 3 and 6 months and I will see her in 6 months. Check b12 levels in 3 months.  She continues to have heavy menstrual cycles. She is thinking about hysterectomy but has not yet made up her mind    Visit Diagnosis 1. Iron deficiency anemia, unspecified iron deficiency anemia type      Dr. Randa Evens, MD, MPH [facility] [phone] 08/15/2018 1:04 PM

## 2018-08-16 ENCOUNTER — Other Ambulatory Visit: Payer: Self-pay | Admitting: Oncology

## 2018-08-16 ENCOUNTER — Telehealth: Payer: Self-pay

## 2018-08-16 NOTE — Telephone Encounter (Signed)
Spoke with the patient to inform her that she needed feraheme, error she is unable to use Feraheme / she would need Venofer weekly x 4. The patient is understanding and agreeable to receive treatment.

## 2018-08-16 NOTE — Telephone Encounter (Signed)
Spoke with the patient to inform her that she is not anemic but her ferritin levels are running low and the patient will benefit form 2 doses of feraheme. The patient was agreeable and understanding to get the feraheme treatment x 2 doses.

## 2018-08-16 NOTE — Telephone Encounter (Signed)
-----   Message from Sindy Guadeloupe, MD sent at 08/15/2018  5:02 PM EDT ----- She is not anemic but ferritin is low. We could giev her IV iron if she is willing. Thanks, Astrid Divine

## 2018-08-17 ENCOUNTER — Inpatient Hospital Stay: Payer: Medicaid Other

## 2018-08-17 VITALS — BP 142/82 | HR 91 | Temp 96.8°F

## 2018-08-17 DIAGNOSIS — D509 Iron deficiency anemia, unspecified: Secondary | ICD-10-CM | POA: Diagnosis not present

## 2018-08-17 MED ORDER — SODIUM CHLORIDE 0.9 % IV SOLN
Freq: Once | INTRAVENOUS | Status: AC
  Administered 2018-08-17: 10:00:00 via INTRAVENOUS
  Filled 2018-08-17: qty 250

## 2018-08-17 MED ORDER — SODIUM CHLORIDE 0.9 % IV SOLN: 200.0000 mg | INTRAVENOUS | Status: DC

## 2018-08-17 MED ORDER — IRON SUCROSE 20 MG/ML IV SOLN
200.0000 mg | Freq: Once | INTRAVENOUS | Status: AC
  Administered 2018-08-17: 200 mg via INTRAVENOUS
  Filled 2018-08-17 (×2): qty 10

## 2018-08-17 NOTE — Patient Instructions (Signed)
Iron Sucrose injection What is this medicine? IRON SUCROSE (AHY ern SOO krohs) is an iron complex. Iron is used to make healthy red blood cells, which carry oxygen and nutrients throughout the body. This medicine is used to treat iron deficiency anemia in people with chronic kidney disease. This medicine may be used for other purposes; ask your health care provider or pharmacist if you have questions. COMMON BRAND NAME(S): Venofer What should I tell my health care provider before I take this medicine? They need to know if you have any of these conditions: -anemia not caused by low iron levels -heart disease -high levels of iron in the blood -kidney disease -liver disease -an unusual or allergic reaction to iron, other medicines, foods, dyes, or preservatives -pregnant or trying to get pregnant -breast-feeding How should I use this medicine? This medicine is for infusion into a vein. It is given by a health care professional in a hospital or clinic setting. Talk to your pediatrician regarding the use of this medicine in children. While this drug may be prescribed for children as young as 2 years for selected conditions, precautions do apply. Overdosage: If you think you have taken too much of this medicine contact a poison control center or emergency room at once. NOTE: This medicine is only for you. Do not share this medicine with others. What if I miss a dose? It is important not to miss your dose. Call your doctor or health care professional if you are unable to keep an appointment. What may interact with this medicine? Do not take this medicine with any of the following medications: -deferoxamine -dimercaprol -other iron products This medicine may also interact with the following medications: -chloramphenicol -deferasirox This list may not describe all possible interactions. Give your health care provider a list of all the medicines, herbs, non-prescription drugs, or dietary  supplements you use. Also tell them if you smoke, drink alcohol, or use illegal drugs. Some items may interact with your medicine. What should I watch for while using this medicine? Visit your doctor or healthcare professional regularly. Tell your doctor or healthcare professional if your symptoms do not start to get better or if they get worse. You may need blood work done while you are taking this medicine. You may need to follow a special diet. Talk to your doctor. Foods that contain iron include: whole grains/cereals, dried fruits, beans, or peas, leafy green vegetables, and organ meats (liver, kidney). What side effects may I notice from receiving this medicine? Side effects that you should report to your doctor or health care professional as soon as possible: -allergic reactions like skin rash, itching or hives, swelling of the face, lips, or tongue -breathing problems -changes in blood pressure -cough -fast, irregular heartbeat -feeling faint or lightheaded, falls -fever or chills -flushing, sweating, or hot feelings -joint or muscle aches/pains -seizures -swelling of the ankles or feet -unusually weak or tired Side effects that usually do not require medical attention (report to your doctor or health care professional if they continue or are bothersome): -diarrhea -feeling achy -headache -irritation at site where injected -nausea, vomiting -stomach upset -tiredness This list may not describe all possible side effects. Call your doctor for medical advice about side effects. You may report side effects to FDA at 1-800-FDA-1088. Where should I keep my medicine? This drug is given in a hospital or clinic and will not be stored at home. NOTE: This sheet is a summary. It may not cover all possible information. If   you have questions about this medicine, talk to your doctor, pharmacist, or health care provider.  2018 [employer] (2011-08-20 17:14:35)  

## 2018-08-24 ENCOUNTER — Inpatient Hospital Stay: Payer: Medicaid Other | Attending: Oncology

## 2018-08-24 VITALS — BP 101/68 | HR 84 | Temp 97.1°F | Resp 20

## 2018-08-24 DIAGNOSIS — D509 Iron deficiency anemia, unspecified: Secondary | ICD-10-CM | POA: Diagnosis not present

## 2018-08-24 MED ORDER — SODIUM CHLORIDE 0.9 % IV SOLN: 200.0000 mg | INTRAVENOUS | Status: DC

## 2018-08-24 MED ORDER — SODIUM CHLORIDE 0.9 % IV SOLN
Freq: Once | INTRAVENOUS | Status: AC
  Administered 2018-08-24: 10:00:00 via INTRAVENOUS
  Filled 2018-08-24: qty 250

## 2018-08-24 MED ORDER — IRON SUCROSE 20 MG/ML IV SOLN
200.0000 mg | Freq: Once | INTRAVENOUS | Status: AC
  Administered 2018-08-24: 200 mg via INTRAVENOUS
  Filled 2018-08-24 (×2): qty 10

## 2018-08-31 ENCOUNTER — Inpatient Hospital Stay: Payer: Medicaid Other

## 2018-08-31 VITALS — BP 120/81 | HR 78 | Temp 97.6°F | Resp 18

## 2018-08-31 DIAGNOSIS — D509 Iron deficiency anemia, unspecified: Secondary | ICD-10-CM | POA: Diagnosis not present

## 2018-08-31 MED ORDER — IRON SUCROSE 20 MG/ML IV SOLN
200.0000 mg | Freq: Once | INTRAVENOUS | Status: AC
  Administered 2018-08-31: 200 mg via INTRAVENOUS
  Filled 2018-08-31: qty 10

## 2018-08-31 MED ORDER — SODIUM CHLORIDE 0.9 % IV SOLN
Freq: Once | INTRAVENOUS | Status: AC
  Administered 2018-08-31: 11:00:00 via INTRAVENOUS
  Filled 2018-08-31: qty 250

## 2018-09-07 ENCOUNTER — Inpatient Hospital Stay: Payer: Medicaid Other

## 2018-09-14 ENCOUNTER — Inpatient Hospital Stay: Payer: Medicaid Other

## 2018-09-14 VITALS — BP 134/87 | HR 82 | Resp 18

## 2018-09-14 DIAGNOSIS — D509 Iron deficiency anemia, unspecified: Secondary | ICD-10-CM

## 2018-09-14 MED ORDER — SODIUM CHLORIDE 0.9 % IV SOLN
Freq: Once | INTRAVENOUS | Status: AC
  Administered 2018-09-14: 11:00:00 via INTRAVENOUS
  Filled 2018-09-14: qty 250

## 2018-09-14 MED ORDER — IRON SUCROSE 20 MG/ML IV SOLN
200.0000 mg | Freq: Once | INTRAVENOUS | Status: AC
  Administered 2018-09-14: 200 mg via INTRAVENOUS
  Filled 2018-09-14: qty 10

## 2018-09-14 NOTE — Patient Instructions (Signed)
Iron Sucrose injection What is this medicine? IRON SUCROSE (AHY ern SOO krohs) is an iron complex. Iron is used to make healthy red blood cells, which carry oxygen and nutrients throughout the body. This medicine is used to treat iron deficiency anemia in people with chronic kidney disease. This medicine may be used for other purposes; ask your health care provider or pharmacist if you have questions. COMMON BRAND NAME(S): Venofer What should I tell my health care provider before I take this medicine? They need to know if you have any of these conditions: -anemia not caused by low iron levels -heart disease -high levels of iron in the blood -kidney disease -liver disease -an unusual or allergic reaction to iron, other medicines, foods, dyes, or preservatives -pregnant or trying to get pregnant -breast-feeding How should I use this medicine? This medicine is for infusion into a vein. It is given by a health care professional in a hospital or clinic setting. Talk to your pediatrician regarding the use of this medicine in children. While this drug may be prescribed for children as young as 2 years for selected conditions, precautions do apply. Overdosage: If you think you have taken too much of this medicine contact a poison control center or emergency room at once. NOTE: This medicine is only for you. Do not share this medicine with others. What if I miss a dose? It is important not to miss your dose. Call your doctor or health care professional if you are unable to keep an appointment. What may interact with this medicine? Do not take this medicine with any of the following medications: -deferoxamine -dimercaprol -other iron products This medicine may also interact with the following medications: -chloramphenicol -deferasirox This list may not describe all possible interactions. Give your health care provider a list of all the medicines, herbs, non-prescription drugs, or dietary  supplements you use. Also tell them if you smoke, drink alcohol, or use illegal drugs. Some items may interact with your medicine. What should I watch for while using this medicine? Visit your doctor or healthcare professional regularly. Tell your doctor or healthcare professional if your symptoms do not start to get better or if they get worse. You may need blood work done while you are taking this medicine. You may need to follow a special diet. Talk to your doctor. Foods that contain iron include: whole grains/cereals, dried fruits, beans, or peas, leafy green vegetables, and organ meats (liver, kidney). What side effects may I notice from receiving this medicine? Side effects that you should report to your doctor or health care professional as soon as possible: -allergic reactions like skin rash, itching or hives, swelling of the face, lips, or tongue -breathing problems -changes in blood pressure -cough -fast, irregular heartbeat -feeling faint or lightheaded, falls -fever or chills -flushing, sweating, or hot feelings -joint or muscle aches/pains -seizures -swelling of the ankles or feet -unusually weak or tired Side effects that usually do not require medical attention (report to your doctor or health care professional if they continue or are bothersome): -diarrhea -feeling achy -headache -irritation at site where injected -nausea, vomiting -stomach upset -tiredness This list may not describe all possible side effects. Call your doctor for medical advice about side effects. You may report side effects to FDA at 1-800-FDA-1088. Where should I keep my medicine? This drug is given in a hospital or clinic and will not be stored at home. NOTE: This sheet is a summary. It may not cover all possible information. If   you have questions about this medicine, talk to your doctor, pharmacist, or health care provider.  2018 [employer] (2011-08-20 17:14:35)  

## 2018-11-14 ENCOUNTER — Inpatient Hospital Stay: Payer: Medicaid Other | Attending: Oncology

## 2018-11-14 DIAGNOSIS — D509 Iron deficiency anemia, unspecified: Secondary | ICD-10-CM | POA: Diagnosis present

## 2018-11-14 LAB — IRON AND TIBC
Iron: 83 ug/dL (ref 28–170)
Saturation Ratios: 21 % (ref 10.4–31.8)
TIBC: 390 ug/dL (ref 250–450)
UIBC: 308 ug/dL

## 2018-11-14 LAB — CBC
HEMATOCRIT: 43.2 % (ref 36.0–46.0)
Hemoglobin: 14.3 g/dL (ref 12.0–15.0)
MCH: 30.1 pg (ref 26.0–34.0)
MCHC: 33.1 g/dL (ref 30.0–36.0)
MCV: 90.9 fL (ref 80.0–100.0)
Platelets: 295 10*3/uL (ref 150–400)
RBC: 4.75 MIL/uL (ref 3.87–5.11)
RDW: 13.2 % (ref 11.5–15.5)
WBC: 10.2 10*3/uL (ref 4.0–10.5)
nRBC: 0 % (ref 0.0–0.2)

## 2018-11-14 LAB — VITAMIN B12: Vitamin B-12: 258 pg/mL (ref 180–914)

## 2018-11-14 LAB — FERRITIN: Ferritin: 71 ng/mL (ref 11–307)

## 2019-02-10 ENCOUNTER — Other Ambulatory Visit: Payer: Self-pay

## 2019-02-10 ENCOUNTER — Telehealth: Payer: Self-pay

## 2019-02-10 NOTE — Telephone Encounter (Signed)
Patient called wating to be referred to [facility] in [place], [place] [phone]. Patient stated that she recently moved and that coming to our office will take her 1.5 hours. Patient would like to keep her appointment for May since she doesn't know if she would be seen sooner at [facility].

## 2019-02-13 ENCOUNTER — Inpatient Hospital Stay: Payer: Medicaid Other | Admitting: Oncology

## 2019-02-13 ENCOUNTER — Inpatient Hospital Stay: Payer: Medicaid Other

## 2019-02-14 NOTE — Telephone Encounter (Signed)
I called and got transferred to the cancer center and they told me to fax it to : [phone].  I faxed the referral to the number and then I called the patient on her cell phone and left her a message that I have contacted the center and asked for an appt. I did leave on the voicemail that the office will get the records and have md review them as to how soon pt. Would need to be seen and take in consideration of coronavirus.  I asked patient that if she does not hear anything in 2 weeks to call me back and I can recheck about the appt status

## 2019-02-27 ENCOUNTER — Telehealth: Payer: Self-pay | Admitting: *Deleted

## 2019-02-27 NOTE — Telephone Encounter (Signed)
Called to see if pt has an appt. When staff checked it then they did not find a match to dob but match to her last 4 digits of social sec. #. The patient had been there in 2016 as a patient. They do not have the fax I sent on 3/24.  Ailene Ravel made sure I was sending it to right fax # and I confirmed it is correct [phone].  I have sent it again but this time attention to [facility] and she will look out for it.

## 2019-04-04 ENCOUNTER — Other Ambulatory Visit: Payer: Medicaid Other

## 2019-04-04 ENCOUNTER — Ambulatory Visit: Payer: Medicaid Other | Admitting: Oncology

## 2019-05-15 ENCOUNTER — Inpatient Hospital Stay: Payer: Medicaid Other | Admitting: Oncology

## 2019-05-15 ENCOUNTER — Inpatient Hospital Stay: Payer: Medicaid Other

## 2021-08-08 ENCOUNTER — Other Ambulatory Visit: Payer: Self-pay | Admitting: Student in an Organized Health Care Education/Training Program
